# Patient Record
Sex: Female | Born: 1965 | Hispanic: Yes | Marital: Married | Smoking: Former smoker
Health system: Southern US, Community
[De-identification: ages and names within clinical notes are randomized; demographics above are authoritative.]

## PROBLEM LIST (undated history)

## (undated) DIAGNOSIS — M1990 Unspecified osteoarthritis, unspecified site: Secondary | ICD-10-CM

## (undated) DIAGNOSIS — I10 Essential (primary) hypertension: Secondary | ICD-10-CM

## (undated) DIAGNOSIS — J353 Hypertrophy of tonsils with hypertrophy of adenoids: Secondary | ICD-10-CM

## (undated) DIAGNOSIS — E119 Type 2 diabetes mellitus without complications: Secondary | ICD-10-CM

## (undated) DIAGNOSIS — N393 Stress incontinence (female) (male): Secondary | ICD-10-CM

## (undated) DIAGNOSIS — Z794 Long term (current) use of insulin: Secondary | ICD-10-CM

## (undated) DIAGNOSIS — R002 Palpitations: Secondary | ICD-10-CM

## (undated) DIAGNOSIS — K219 Gastro-esophageal reflux disease without esophagitis: Secondary | ICD-10-CM

## (undated) DIAGNOSIS — J4530 Mild persistent asthma, uncomplicated: Secondary | ICD-10-CM

## (undated) DIAGNOSIS — G4733 Obstructive sleep apnea (adult) (pediatric): Secondary | ICD-10-CM

## (undated) HISTORY — DX: Obstructive sleep apnea (adult) (pediatric): G47.33

---

## 1999-08-07 HISTORY — PX: BREAST ENHANCEMENT SURGERY: SHX7

## 2011-08-07 HISTORY — PX: CATARACT EXTRACTION W/ INTRAOCULAR LENS  IMPLANT, BILATERAL: SHX1307

## 2013-02-24 ENCOUNTER — Encounter (HOSPITAL_BASED_OUTPATIENT_CLINIC_OR_DEPARTMENT_OTHER): Payer: Self-pay | Admitting: *Deleted

## 2013-02-24 ENCOUNTER — Emergency Department (HOSPITAL_BASED_OUTPATIENT_CLINIC_OR_DEPARTMENT_OTHER)
Admission: EM | Admit: 2013-02-24 | Discharge: 2013-02-24 | Disposition: A | Discharge status: Home / Self Care | Payer: Medicare Other | Attending: Emergency Medicine | Admitting: Emergency Medicine

## 2013-02-24 DIAGNOSIS — R6889 Other general symptoms and signs: Secondary | ICD-10-CM | POA: Insufficient documentation

## 2013-02-24 DIAGNOSIS — E119 Type 2 diabetes mellitus without complications: Secondary | ICD-10-CM | POA: Insufficient documentation

## 2013-02-24 DIAGNOSIS — Z79899 Other long term (current) drug therapy: Secondary | ICD-10-CM | POA: Insufficient documentation

## 2013-02-24 DIAGNOSIS — T7840XA Allergy, unspecified, initial encounter: Secondary | ICD-10-CM

## 2013-02-24 DIAGNOSIS — L272 Dermatitis due to ingested food: Secondary | ICD-10-CM | POA: Insufficient documentation

## 2013-02-24 DIAGNOSIS — J45901 Unspecified asthma with (acute) exacerbation: Secondary | ICD-10-CM | POA: Insufficient documentation

## 2013-02-24 DIAGNOSIS — R609 Edema, unspecified: Secondary | ICD-10-CM | POA: Insufficient documentation

## 2013-02-24 DIAGNOSIS — R1310 Dysphagia, unspecified: Secondary | ICD-10-CM | POA: Insufficient documentation

## 2013-02-24 DIAGNOSIS — R0602 Shortness of breath: Secondary | ICD-10-CM | POA: Insufficient documentation

## 2013-02-24 MED ORDER — PREDNISONE 10 MG PO TABS: 20.0000 mg | ORAL_TABLET | Freq: Every day | ORAL | Status: DC

## 2013-02-24 MED ORDER — EPINEPHRINE 0.3 MG/0.3ML IJ SOAJ: 0.3000 mg | INTRAMUSCULAR | Status: DC | PRN

## 2013-02-24 MED ORDER — DIPHENHYDRAMINE HCL 25 MG PO TABS: 25.0000 mg | ORAL_TABLET | Freq: Four times a day (QID) | ORAL | Status: DC

## 2013-02-24 MED ORDER — METHYLPREDNISOLONE SODIUM SUCC 125 MG IJ SOLR
125.0000 mg | Freq: Once | INTRAMUSCULAR | Status: AC
  Administered 2013-02-24: 125 mg via INTRAVENOUS
  Filled 2013-02-24: qty 2

## 2013-02-24 NOTE — ED Notes (Signed)
Pt was also given 0.3mg  SQ by EMS

## 2013-02-24 NOTE — ED Notes (Signed)
Pt was eating dinner when she noticed that she had ingested shrimp, unknown amount. Pt reports feeling SOB and facial swelling.

## 2013-02-25 NOTE — ED Provider Notes (Signed)
History    CSN: [id number] Arrival date & time 02/24/13  2139  First MD Initiated Contact with Patient 02/24/13 2300     Chief Complaint  Patient presents with  . Allergic Reaction   (Consider location/radiation/quality/duration/timing/severity/associated sxs/prior Treatment) Patient is a 47 y.o. female presenting with allergic reaction. The history is provided by the patient and the EMS personnel. No language interpreter was used.  Allergic Reaction Presenting symptoms: difficulty breathing, difficulty swallowing, itching, swelling and wheezing   Presenting symptoms: no rash   Presenting symptoms comment:  Pt had inadvertantly eaten shrimp, to which she knows she is allergic.  She develop tightness in her throat and neck, and felt that her whole body was swelling.  She had some wheezing. Difficulty breathing:    Severity:  Severe   Onset quality:  Sudden   Duration:  1 hour   Timing:  Constant   Progression:  Unchanged Itching:    Location:  Full body   Severity:  Severe   Onset quality:  Sudden   Duration:  1 hour   Timing:  Constant   Progression:  Unchanged Severity:  Mild Prior episodes: Prior similar allergic reactions to eating shrimp. Context: food   Relieved by:  Nothing Worsened by:  Nothing tried Ineffective treatments:  None tried  Past Medical History  Diagnosis Date  . Diabetes mellitus without complication   . Asthma    History reviewed. No pertinent past surgical history. No family history on file. History  Substance Use Topics  . Smoking status: Not on file  . Smokeless tobacco: Not on file  . Alcohol Use: Not on file   OB History   Grav Para Term Preterm Abortions TAB SAB Ect Mult Living                 Review of Systems  Constitutional: Negative for fever and chills.  HENT: Positive for facial swelling and trouble swallowing.   Respiratory: Positive for shortness of breath and wheezing.   Cardiovascular: Negative.   Gastrointestinal:  Negative.   Genitourinary: Negative.   Musculoskeletal: Negative.   Skin: Positive for itching. Negative for rash.  Neurological: Negative.   Psychiatric/Behavioral: Negative.     Allergies  Shellfish allergy  Home Medications   Current Outpatient Rx  Name  Route  Sig  Dispense  Refill  . albuterol (PROVENTIL) (5 MG/ML) 0.5% nebulizer solution   Nebulization   Take 5 mg by nebulization every 6 (six) hours as needed for wheezing.         . diphenhydrAMINE (BENADRYL) 50 MG capsule   Intravenous   Inject 50 mg into the vein every 6 (six) hours as needed for itching.         Marland Kitchen ipratropium-albuterol (DUONEB) 0.5-2.5 (3) MG/3ML SOLN   Nebulization   Take 3 mLs by nebulization.         . ranitidine (ZANTAC) 15 MG/ML syrup   Intravenous   Inject 50 mg into the vein 2 (two) times daily.         . diphenhydrAMINE (BENADRYL) 25 MG tablet   Oral   Take 1 tablet (25 mg total) by mouth every 6 (six) hours.   20 tablet   0   . EPINEPHrine (EPIPEN) 0.3 mg/0.3 mL SOAJ   Intramuscular   Inject 0.3 mLs (0.3 mg total) into the muscle as needed (Inject yourself if you have an alllergic reaction, then come immediately to the ED for further treatment.).   1 Device  0   . predniSONE (DELTASONE) 10 MG tablet   Oral   Take 2 tablets (20 mg total) by mouth daily.   10 tablet   0    BP 136/62  Pulse 113  Temp(Src) 98.2 F (36.8 C) (Oral)  Resp 18  SpO2 98% Physical Exam  Nursing note and vitals reviewed. Constitutional: She is oriented to person, place, and time.  Middle aged woman in mild-moderate distress complaining of tightness in the throat, shortness of breath, and a feeling that her body was swelling.  HENT:  Head: Normocephalic and atraumatic.  Right Ear: External ear normal.  Left Ear: External ear normal.  Mouth/Throat: Oropharynx is clear and moist.  Eyes: Conjunctivae and EOM are normal. Pupils are equal, round, and reactive to light.  Neck: Normal range of  motion. Neck supple.  Airway patent.  Cardiovascular: Normal rate, regular rhythm and normal heart sounds.   Pulmonary/Chest: Effort normal and breath sounds normal.  No audible wheezes.  Abdominal: Soft. Bowel sounds are normal.  Musculoskeletal: Normal range of motion. She exhibits no edema and no tenderness.  Neurological: She is alert and oriented to person, place, and time.  No sensory or motor deficit.  Skin: Skin is warm and dry. No rash noted. No erythema.  Psychiatric: She has a normal mood and affect. Her behavior is normal.    ED Course  Procedures (including critical care time)  Course in the ED:  Pt had eaten shrimp; she thought it was fish.  She has a known allergy to shrimp.  She developed tightness in her throat and neck and hands.  It was hard to breathe.  EMS was called and they gave her albuterol nebulizer treatments, Pepcid, and Benadryl.  She was starting to feel better by the time she got to [facility] ED. Pt was seen and had physical examination.  She was given Solumedrol 125 mg IV.  Over an hour, her symptoms resolved.  She was prescribed prednisone 40 mg qd x 5 days, Benadryl 25 mg po q4h prn itching, and an Epipen, to use if she developed an allergic reaction in the future and then go immediately to a hospital ED for further evaluation and treatment.    1. Allergic reaction, initial encounter         Carleene Cooper III, MD 02/25/13 1304

## 2014-03-30 ENCOUNTER — Emergency Department (HOSPITAL_BASED_OUTPATIENT_CLINIC_OR_DEPARTMENT_OTHER)
Admission: EM | Admit: 2014-03-30 | Discharge: 2014-03-30 | Disposition: A | Discharge status: Home / Self Care | Payer: Medicare Other | Attending: Emergency Medicine | Admitting: Emergency Medicine

## 2014-03-30 ENCOUNTER — Emergency Department (HOSPITAL_BASED_OUTPATIENT_CLINIC_OR_DEPARTMENT_OTHER): Payer: Medicare Other

## 2014-03-30 ENCOUNTER — Emergency Department (HOSPITAL_COMMUNITY)
Admission: EM | Admit: 2014-03-30 | Discharge: 2014-03-30 | Discharge status: Against Medical Advice | Payer: Medicare Other

## 2014-03-30 ENCOUNTER — Encounter (HOSPITAL_BASED_OUTPATIENT_CLINIC_OR_DEPARTMENT_OTHER): Payer: Self-pay | Admitting: Emergency Medicine

## 2014-03-30 DIAGNOSIS — Z87891 Personal history of nicotine dependence: Secondary | ICD-10-CM | POA: Diagnosis not present

## 2014-03-30 DIAGNOSIS — Z8669 Personal history of other diseases of the nervous system and sense organs: Secondary | ICD-10-CM | POA: Insufficient documentation

## 2014-03-30 DIAGNOSIS — K2970 Gastritis, unspecified, without bleeding: Secondary | ICD-10-CM | POA: Insufficient documentation

## 2014-03-30 DIAGNOSIS — R52 Pain, unspecified: Secondary | ICD-10-CM | POA: Diagnosis not present

## 2014-03-30 DIAGNOSIS — J45909 Unspecified asthma, uncomplicated: Secondary | ICD-10-CM | POA: Insufficient documentation

## 2014-03-30 DIAGNOSIS — E119 Type 2 diabetes mellitus without complications: Secondary | ICD-10-CM | POA: Insufficient documentation

## 2014-03-30 DIAGNOSIS — IMO0002 Reserved for concepts with insufficient information to code with codable children: Secondary | ICD-10-CM | POA: Diagnosis not present

## 2014-03-30 DIAGNOSIS — D649 Anemia, unspecified: Secondary | ICD-10-CM | POA: Diagnosis not present

## 2014-03-30 DIAGNOSIS — Z79899 Other long term (current) drug therapy: Secondary | ICD-10-CM | POA: Insufficient documentation

## 2014-03-30 DIAGNOSIS — R109 Unspecified abdominal pain: Secondary | ICD-10-CM | POA: Diagnosis present

## 2014-03-30 DIAGNOSIS — K2990 Gastroduodenitis, unspecified, without bleeding: Secondary | ICD-10-CM | POA: Diagnosis not present

## 2014-03-30 DIAGNOSIS — K219 Gastro-esophageal reflux disease without esophagitis: Secondary | ICD-10-CM | POA: Insufficient documentation

## 2014-03-30 DIAGNOSIS — Z794 Long term (current) use of insulin: Secondary | ICD-10-CM | POA: Insufficient documentation

## 2014-03-30 HISTORY — DX: Gastro-esophageal reflux disease without esophagitis: K21.9

## 2014-03-30 LAB — CBC WITH DIFFERENTIAL/PLATELET
BASOS ABS: 0 10*3/uL (ref 0.0–0.1)
BASOS PCT: 0 % (ref 0–1)
BLASTS: 0 %
Band Neutrophils: 0 % (ref 0–10)
Eosinophils Absolute: 0.3 10*3/uL (ref 0.0–0.7)
Eosinophils Relative: 3 % (ref 0–5)
HCT: 27.6 % — ABNORMAL LOW (ref 36.0–46.0)
HEMOGLOBIN: 8.1 g/dL — AB (ref 12.0–15.0)
LYMPHS ABS: 2.8 10*3/uL (ref 0.7–4.0)
LYMPHS PCT: 27 % (ref 12–46)
MCH: 17.9 pg — AB (ref 26.0–34.0)
MCHC: 29.3 g/dL — AB (ref 30.0–36.0)
MCV: 60.9 fL — ABNORMAL LOW (ref 78.0–100.0)
MYELOCYTES: 0 %
Metamyelocytes Relative: 0 %
Monocytes Absolute: 0.3 10*3/uL (ref 0.1–1.0)
Monocytes Relative: 3 % (ref 3–12)
NEUTROS ABS: 6.9 10*3/uL (ref 1.7–7.7)
NEUTROS PCT: 67 % (ref 43–77)
PROMYELOCYTES ABS: 0 %
Platelets: 567 10*3/uL — ABNORMAL HIGH (ref 150–400)
RBC: 4.53 MIL/uL (ref 3.87–5.11)
RDW: 20 % — AB (ref 11.5–15.5)
WBC: 10.3 10*3/uL (ref 4.0–10.5)
nRBC: 0 /100 WBC

## 2014-03-30 LAB — URINALYSIS, ROUTINE W REFLEX MICROSCOPIC
BILIRUBIN URINE: NEGATIVE
Glucose, UA: 500 mg/dL — AB
Hgb urine dipstick: NEGATIVE
KETONES UR: NEGATIVE mg/dL
LEUKOCYTES UA: NEGATIVE
NITRITE: NEGATIVE
PH: 6.5 (ref 5.0–8.0)
PROTEIN: NEGATIVE mg/dL
Specific Gravity, Urine: 1.019 (ref 1.005–1.030)
UROBILINOGEN UA: 0.2 mg/dL (ref 0.0–1.0)

## 2014-03-30 LAB — COMPREHENSIVE METABOLIC PANEL
ALBUMIN: 3.1 g/dL — AB (ref 3.5–5.2)
ALK PHOS: 67 U/L (ref 39–117)
ALT: 100 U/L — ABNORMAL HIGH (ref 0–35)
AST: 91 U/L — AB (ref 0–37)
Anion gap: 15 (ref 5–15)
BUN: 15 mg/dL (ref 6–23)
CHLORIDE: 97 meq/L (ref 96–112)
CO2: 25 meq/L (ref 19–32)
Calcium: 9.4 mg/dL (ref 8.4–10.5)
Creatinine, Ser: 0.7 mg/dL (ref 0.50–1.10)
GFR calc Af Amer: >90 mL/min (ref >90)
Glucose, Bld: 177 mg/dL — ABNORMAL HIGH (ref 70–99)
POTASSIUM: 3.7 meq/L (ref 3.7–5.3)
Sodium: 137 mEq/L (ref 137–147)
Total Protein: 7.8 g/dL (ref 6.0–8.3)

## 2014-03-30 LAB — TROPONIN I: Troponin I: <0.3 ng/mL (ref <0.30)

## 2014-03-30 LAB — LIPASE, BLOOD: Lipase: 42 U/L (ref 11–59)

## 2014-03-30 MED ORDER — ESOMEPRAZOLE MAGNESIUM 40 MG PO CPDR: 40.0000 mg | DELAYED_RELEASE_CAPSULE | Freq: Two times a day (BID) | ORAL | Status: DC

## 2014-03-30 MED ORDER — FERROUS SULFATE 325 (65 FE) MG PO TABS: 325.0000 mg | ORAL_TABLET | Freq: Every day | ORAL | Status: DC

## 2014-03-30 MED ORDER — SUCRALFATE 1 G PO TABS: 1.0000 g | ORAL_TABLET | Freq: Four times a day (QID) | ORAL | Status: DC

## 2014-03-30 MED ORDER — HYDROCODONE-ACETAMINOPHEN 5-325 MG PO TABS
1.0000 | ORAL_TABLET | ORAL | Status: DC | PRN
Start: 2014-03-30 — End: 2015-08-18

## 2014-03-30 NOTE — Discharge Instructions (Signed)
Gastritis, Adult Gastritis is soreness and puffiness (inflammation) of the lining of the stomach. If you do not get help, gastritis can cause bleeding and sores (ulcers) in the stomach. HOME CARE   Only take medicine as told by your doctor.  If you were given antibiotic medicines, take them as told. Finish the medicines even if you start to feel better.  Drink enough fluids to keep your pee (urine) clear or pale yellow.  Avoid foods and drinks that make your problems worse. Foods you may want to avoid include:  Caffeine or alcohol.  Chocolate.  Mint.  Garlic and onions.  Spicy foods.  Citrus fruits, including oranges, lemons, or limes.  Food containing tomatoes, including sauce, chili, salsa, and pizza.  Fried and fatty foods.  Eat small meals throughout the day instead of large meals. GET HELP RIGHT AWAY IF:   You have black or dark red poop (stools).  You throw up (vomit) blood. It may look like coffee grounds.  You cannot keep fluids down.  Your belly (abdominal) pain gets worse.  You have a fever.  You do not feel better after 1 week.  You have any other questions or concerns. MAKE SURE YOU:   Understand these instructions.  Will watch your condition.  Will get help right away if you are not doing well or get worse. Document Released: 01/09/2008 Document Revised: 10/15/2011 Document Reviewed: 09/05/2011 [facility] Patient Information 2015 [place], [place]. This information is not intended to replace advice given to you by your health care provider. Make sure you discuss any questions you have with your health care provider.  Anemia, Nonspecific Anemia is a condition in which the concentration of red blood cells or hemoglobin in the blood is below normal. Hemoglobin is a substance in red blood cells that carries oxygen to the tissues of the body. Anemia results in not enough oxygen reaching these tissues.  CAUSES  Common causes of anemia include:   Excessive  bleeding. Bleeding may be internal or external. This includes excessive bleeding from periods (in women) or from the intestine.   Poor nutrition.   Chronic kidney, thyroid, and liver disease.  Bone marrow disorders that decrease red blood cell production.  Cancer and treatments for cancer.  HIV, AIDS, and their treatments.  Spleen problems that increase red blood cell destruction.  Blood disorders.  Excess destruction of red blood cells due to infection, medicines, and autoimmune disorders. SIGNS AND SYMPTOMS   Minor weakness.   Dizziness.   Headache.  Palpitations.   Shortness of breath, especially with exercise.   Paleness.  Cold sensitivity.  Indigestion.  Nausea.  Difficulty sleeping.  Difficulty concentrating. Symptoms may occur suddenly or they may develop slowly.  DIAGNOSIS  Additional blood tests are often needed. These help your health care provider determine the best treatment. Your health care provider will check your stool for blood and look for other causes of blood loss.  TREATMENT  Treatment varies depending on the cause of the anemia. Treatment can include:   Supplements of iron, vitamin I43, or folic acid.   Hormone medicines.   A blood transfusion. This may be needed if blood loss is severe.   Hospitalization. This may be needed if there is significant continual blood loss.   Dietary changes.  Spleen removal. HOME CARE INSTRUCTIONS Keep all follow-up appointments. It often takes many weeks to correct anemia, and having your health care provider check on your condition and your response to treatment is very important. SEEK IMMEDIATE  MEDICAL CARE IF:   You develop extreme weakness, shortness of breath, or chest pain.   You become dizzy or have trouble concentrating.  You develop heavy vaginal bleeding.   You develop a rash.   You have bloody or black, tarry stools.   You faint.   You vomit up blood.   You  vomit repeatedly.   You have abdominal pain.  You have a fever or persistent symptoms for more than 2-3 days.   You have a fever and your symptoms suddenly get worse.   You are dehydrated.  MAKE SURE YOU:  Understand these instructions.  Will watch your condition.  Will get help right away if you are not doing well or get worse. Document Released: 08/30/2004 Document Revised: 03/25/2013 Document Reviewed: 01/16/2013 [facility] Patient Information 2015 [place], [place]. This information is not intended to replace advice given to you by your health care provider. Make sure you discuss any questions you have with your health care provider.

## 2014-03-30 NOTE — ED Provider Notes (Signed)
Patient seen and evaluated. An EGD 4-6 weeks ago showing "inflammation" was on omeprazole. No medicine changes at time of that diagnosis. Describes epigastric pain and burning. Normal ultrasound. Normal hepatobiliary and pancreatic enzymes. Hemoglobin of 8. Describes history of anemia, thought secondary to menorrhagia by her primary care physician in April. No bleeding or dark stools now. Plan: Carafate, Vicodin, avoid Advil, Nexium.  PCP f/u for anemia, Iron.  Tanna Furry, MD 03/30/14 1233

## 2014-03-30 NOTE — ED Notes (Signed)
Abdominal pain x 11 days with daily vomiting.  Vomited x 2 in past 24 hours. Back pain that started yesterday.

## 2014-03-30 NOTE — ED Provider Notes (Signed)
CSN: [id number]     Arrival date & time 03/30/14  1012 History   First MD Initiated Contact with Patient 03/30/14 1039   History provided by patient. Husband at bedside.   Chief Complaint  Patient presents with  . Abdominal Pain   HPI  Patient reports complaint of persistent abdominal pain, nausea and vomiting. States that symptoms started about 11 days ago following a party where she believes she "got sick after eating the food", a few hours after eating she had generalized body aches, stomach cramps, nausea, vomiting, and diarrhea for several days, diarrhea has resolved now with occasional loose stools. Admits to improvement 2 days ago with reduced abdominal pain, now with worsening symptoms. Currently describes abdominal pain as constant aching "across upper" abdomen, significantly worse with eating and drinking, tried Pepto-bismal and Ginger ale with mild improvement, also pain relieved by vomiting. Last vomiting was 24 hours ago, denies bilious or bloody vomit. Denies any fevers/chills, cough, chest pain, SOB, bloody stool, constipation or change in bowel habits. Reports son with similar symptoms following close contact (currently improved).  Significant PMH with DM2, known GERD, s/p EGD and Colonoscopy (1 month ago at [facility]) reported to have "mild inflammation" unsure if this was located in esophagus or stomach, denies hx bleeding ulcer, taking omeprazole currently. No history of prior pancreatitis, cholecystitis, and no regular EtOH use. Also admits to prior history of anemia, and endorses some heavy bleeding with regular periods, LMP 03/13/14.  Past Medical History  Diagnosis Date  . Diabetes mellitus without complication   . Asthma   . GERD (gastroesophageal reflux disease)   . Cataract    Past Surgical History  Procedure Laterality Date  . Eye surgery     No family history on file. History  Substance Use Topics  . Smoking status: Former Smoker    Types: Cigarettes  .  Smokeless tobacco: Not on file  . Alcohol Use: Yes     Comment: occasional   OB History   Grav Para Term Preterm Abortions TAB SAB Ect Mult Living                 Review of Systems  See above HPI  Allergies  Shellfish allergy  Home Medications   Prior to Admission medications   Medication Sig Start Date End Date Taking? Authorizing Provider  Fluticasone-Salmeterol (ADVAIR HFA IN) Inhale into the lungs.   Yes Historical Provider, MD  insulin glargine (LANTUS) 100 UNIT/ML injection Inject 40 Units into the skin at bedtime.   Yes Historical Provider, MD  sitaGLIPtin-metformin (JANUMET) 50-1000 MG per tablet Take 1 tablet by mouth 2 (two) times daily with a meal.   Yes Historical Provider, MD  THEOPHYLLINE CR PO Take by mouth.   Yes Historical Provider, MD  albuterol (PROVENTIL) (5 MG/ML) 0.5% nebulizer solution Take 5 mg by nebulization every 6 (six) hours as needed for wheezing.    Historical Provider, MD  diphenhydrAMINE (BENADRYL) 25 MG tablet Take 1 tablet (25 mg total) by mouth every 6 (six) hours. 02/24/13   Mylinda Latina, MD  diphenhydrAMINE (BENADRYL) 50 MG capsule Inject 50 mg into the vein every 6 (six) hours as needed for itching.    Historical Provider, MD  EPINEPHrine (EPIPEN) 0.3 mg/0.3 mL SOAJ Inject 0.3 mLs (0.3 mg total) into the muscle as needed (Inject yourself if you have an alllergic reaction, then come immediately to the ED for further treatment.). 02/24/13   Mylinda Latina, MD  esomeprazole (NEXIUM) 40 MG capsule  Take 1 capsule (40 mg total) by mouth 2 (two) times daily. 03/30/14   Tanna Furry, MD  ferrous sulfate 325 (65 FE) MG tablet Take 1 tablet (325 mg total) by mouth daily. 03/30/14   Tanna Furry, MD  HYDROcodone-acetaminophen (NORCO/VICODIN) 5-325 MG per tablet Take 1 tablet by mouth every 4 (four) hours as needed. 03/30/14   Tanna Furry, MD  ipratropium-albuterol (DUONEB) 0.5-2.5 (3) MG/3ML SOLN Take 3 mLs by nebulization.    Historical Provider, MD  predniSONE  (DELTASONE) 10 MG tablet Take 2 tablets (20 mg total) by mouth daily. 02/24/13   Mylinda Latina, MD  ranitidine (ZANTAC) 15 MG/ML syrup Inject 50 mg into the vein 2 (two) times daily.    Historical Provider, MD  sucralfate (CARAFATE) 1 G tablet Take 1 tablet (1 g total) by mouth 4 (four) times daily. 03/30/14   Tanna Furry, MD   BP 143/74  Pulse 86  Temp(Src) 98 F (36.7 C) (Oral)  Resp 16  Ht 5\' 1"  (1.549 m)  Wt 170 lb (77.111 kg)  BMI 32.14 kg/m2  SpO2 99%  LMP 03/13/2014 Physical Exam  Gen - well-appearing, NAD HEENT - oropharynx clear, MMM Heart - tachycardia, regular rhythm, no murmurs heard Lungs - CTAB, no wheezing, crackles, or rhonchi. Normal work of breathing. Abd - soft, bilateral upper abdominal tenderness to palpation, no guarding or rebound, negative Murphy's, negative McBurney's, no masses, +hyperactive BS Ext - non-tender, no edema, peripheral pulses intact +2 b/l Skin - warm, dry, no rashes Neuro - awake, alert, oriented, grossly non-focal  ED Course  Procedures (including critical care time) Labs Review Labs Reviewed  URINALYSIS, ROUTINE W REFLEX MICROSCOPIC - Abnormal; Notable for the following:    Glucose, UA 500 (*)    All other components within normal limits  CBC WITH DIFFERENTIAL - Abnormal; Notable for the following:    Hemoglobin 8.1 (*)    HCT 27.6 (*)    MCV 60.9 (*)    MCH 17.9 (*)    MCHC 29.3 (*)    RDW 20.0 (*)    Platelets 567 (*)    All other components within normal limits  COMPREHENSIVE METABOLIC PANEL - Abnormal; Notable for the following:    Glucose, Bld 177 (*)    Albumin 3.1 (*)    AST 91 (*)    ALT 100 (*)    Total Bilirubin <0.2 (*)    All other components within normal limits  LIPASE, BLOOD  TROPONIN I    Imaging Review US Abdomen Complete  03/30/2014   CLINICAL DATA:  Abdominal pain  EXAM: ULTRASOUND ABDOMEN COMPLETE  COMPARISON:  None.  FINDINGS: Gallbladder:  No gallstones or wall thickening visualized. No sonographic  Murphy sign noted.  Common bile duct:  Diameter: 3 mm  Liver:  No focal lesion identified. Mild increased parenchymal echogenicity.  IVC:  No abnormality visualized.  Pancreas:  Visualized portion unremarkable.  Spleen:  Size and appearance within normal limits.  Right Kidney:  Length: 10.5 cm. Echogenicity within normal limits. No mass or hydronephrosis visualized.  Left Kidney:  Length: 11.5 cm. Echogenicity within normal limits. No mass or hydronephrosis visualized.  Abdominal aorta:  No aneurysm visualized.  Other findings:  None.  IMPRESSION: 1. No cholelithiasis or sonographic evidence of acute cholecystitis.   Electronically Signed   By: Kathreen Devoid   On: 03/30/2014 11:53     EKG Interpretation None      MDM   Final diagnoses:  Gastritis  Anemia, unspecified anemia  type   18 yr F with PMH DM2, GERD, s/p EGD (mild inflammation, ?esophagus vs stomach) and Colonoscopy (1 month ago). No hx of PUD, hematemesis, rectal bleeding or bloody stools. Presents with worsening abdominal pain with associated nausea/vomiting x 11 days (initially with n/v and diarrhea, since improved, then pain returned few days ago), constant pain in epigastric upper abd region worse with food/drink, also with back pain in similar region. Wide differential, suspect pain is related to GERD, potential esophageal ulcer, and possible gastritis, additionally concerned for possible cholecystitis (afebrile, negative Murphy's less likely, but inc risk factors), consider pancreatitis (with radiating back pain), unlikely appendicitis (neg McBurney, not consistent).  Proceed with EKG, Complete Abd [place], CMET, Lipase, CBC, UA.  UPDATE @ 1205 - Patient with stable complaints, no new symptoms, denies acute nausea or worsening abdominal pain. Able to tolerate PO. Results reviewed with EKG (NSR, no acute ST-T wave changes), Abd [place] (negative for cholelithiasis or cholecystitis, no other acute findings), CMET and Lipase (47) unremarkable, CBC  remarkable for Hgb 8.1 with MCV 60 (significant for microcytic anemia, suspected iron deficiency), WBC 10.3 (not suggestive of acute intra-abdominal infxn). Denies acute symptoms from Hgb, no lightheaded/dizziness, fatigue. Suspect chronic anemia, possible due to menometrorrhagia  Discharge to home with rx Nexium 40mg  BID (change from Omeprazole for indication of possible esophageal ulcer in setting of likely gastritis), rx Carafate 1g QID, start Ferrous sulfate 325mg  1 tab daily (may need to titrate up, cautious about worsening GI side-effects), recommended close follow-up with new PCP and GI doctors, return precautions given.     Nobie Putnam, DO 03/30/14 1306

## 2014-03-30 NOTE — ED Notes (Signed)
MD at bedside. 

## 2014-03-30 NOTE — ED Notes (Signed)
Pt reports intermittent chest pain associated with ambulating

## 2014-03-31 ENCOUNTER — Encounter (HOSPITAL_BASED_OUTPATIENT_CLINIC_OR_DEPARTMENT_OTHER): Payer: Self-pay | Admitting: Emergency Medicine

## 2014-03-31 ENCOUNTER — Emergency Department (HOSPITAL_BASED_OUTPATIENT_CLINIC_OR_DEPARTMENT_OTHER)
Admission: EM | Admit: 2014-03-31 | Discharge: 2014-03-31 | Disposition: A | Discharge status: Home / Self Care | Payer: Medicare Other | Attending: Emergency Medicine | Admitting: Emergency Medicine

## 2014-03-31 DIAGNOSIS — E119 Type 2 diabetes mellitus without complications: Secondary | ICD-10-CM | POA: Diagnosis not present

## 2014-03-31 DIAGNOSIS — R1012 Left upper quadrant pain: Secondary | ICD-10-CM | POA: Diagnosis not present

## 2014-03-31 DIAGNOSIS — R1011 Right upper quadrant pain: Secondary | ICD-10-CM | POA: Insufficient documentation

## 2014-03-31 DIAGNOSIS — Z79899 Other long term (current) drug therapy: Secondary | ICD-10-CM | POA: Diagnosis not present

## 2014-03-31 DIAGNOSIS — Z87891 Personal history of nicotine dependence: Secondary | ICD-10-CM | POA: Insufficient documentation

## 2014-03-31 DIAGNOSIS — K219 Gastro-esophageal reflux disease without esophagitis: Secondary | ICD-10-CM | POA: Diagnosis not present

## 2014-03-31 DIAGNOSIS — J45901 Unspecified asthma with (acute) exacerbation: Secondary | ICD-10-CM | POA: Insufficient documentation

## 2014-03-31 DIAGNOSIS — Z8669 Personal history of other diseases of the nervous system and sense organs: Secondary | ICD-10-CM | POA: Diagnosis not present

## 2014-03-31 DIAGNOSIS — R112 Nausea with vomiting, unspecified: Secondary | ICD-10-CM | POA: Insufficient documentation

## 2014-03-31 DIAGNOSIS — R1013 Epigastric pain: Secondary | ICD-10-CM | POA: Diagnosis present

## 2014-03-31 DIAGNOSIS — Z794 Long term (current) use of insulin: Secondary | ICD-10-CM | POA: Insufficient documentation

## 2014-03-31 DIAGNOSIS — R51 Headache: Secondary | ICD-10-CM | POA: Insufficient documentation

## 2014-03-31 MED ORDER — PROMETHAZINE HCL 12.5 MG PO TABS: 12.5000 mg | ORAL_TABLET | Freq: Four times a day (QID) | ORAL | Status: DC | PRN

## 2014-03-31 MED ORDER — HYDROMORPHONE HCL PF 1 MG/ML IJ SOLN
INTRAMUSCULAR | Status: AC
  Administered 2014-03-31: 2 mg via INTRAMUSCULAR
  Filled 2014-03-31: qty 2

## 2014-03-31 MED ORDER — HYDROMORPHONE HCL PF 2 MG/ML IJ SOLN
2.0000 mg | Freq: Once | INTRAMUSCULAR | Status: AC
Start: 2014-03-31 — End: 2014-03-31
  Filled 2014-03-31: qty 1

## 2014-03-31 MED ORDER — ONDANSETRON 4 MG PO TBDP: 4.0000 mg | ORAL_TABLET | Freq: Three times a day (TID) | ORAL | Status: DC | PRN

## 2014-03-31 MED ORDER — ONDANSETRON 4 MG PO TBDP
4.0000 mg | ORAL_TABLET | Freq: Once | ORAL | Status: AC
  Administered 2014-03-31: 4 mg via ORAL
  Filled 2014-03-31: qty 1

## 2014-03-31 NOTE — ED Notes (Signed)
MD at bedside. 

## 2014-03-31 NOTE — ED Provider Notes (Signed)
CSN: [id number]     Arrival date & time 03/31/14  1850 History  This chart was scribed for Leslie Sorrow, MD by Martinique Peace, ED Scribe. The patient was seen in [place]. The patient's care was started at 8:32 PM.    Chief Complaint  Patient presents with  . Abdominal Pain      Patient is a 48 y.o. female presenting with abdominal pain. The history is provided by the patient. No language interpreter was used.  Abdominal Pain Pain location:  Epigastric, LUQ and RUQ Pain quality: cramping   Pain radiates to:  Back Pain severity:  Severe Duration:  12 days Progression:  Unchanged Relieved by:  Nothing Worsened by:  Eating, deep breathing and vomiting Associated symptoms: fatigue, nausea, shortness of breath and vomiting   Associated symptoms: no chest pain, no chills, no cough, no diarrhea, no fever and no sore throat   Risk factors: alcohol abuse (occasional)   HPI Comments: Leslie Scott is a 48 y.o. female who presents to the Emergency Department complaining of continued aching epigastric abdominal pain that radiates bilaterally around her abdomen to her back with associated headache, nausea, and vomiting. She rates pain as 10/10 currently and denies any type of relief or improvement in pain. Pt reports that pain is exacerbated when she eats within 20 mins after consumption. Pt futher reports having upper endoscopy performed 2 months ago. Pt was seen yesterday here at the ED and given prescriptions for Nexium (40mg ), Carafate (1g), and Ferrous Sulfate (1 tablet). One episode of vomiting this morning.    Past Medical History  Diagnosis Date  . Diabetes mellitus without complication   . Asthma   . GERD (gastroesophageal reflux disease)   . Cataract    Past Surgical History  Procedure Laterality Date  . Eye surgery     No family history on file. History  Substance Use Topics  . Smoking status: Former Smoker    Types: Cigarettes  . Smokeless tobacco: Not on file  . Alcohol  Use: Yes     Comment: occasional   OB History   Grav Para Term Preterm Abortions TAB SAB Ect Mult Living                 Review of Systems  Constitutional: Positive for fatigue. Negative for fever and chills.  HENT: Negative for rhinorrhea and sore throat.   Eyes: Negative for visual disturbance.  Respiratory: Positive for shortness of breath. Negative for cough.   Cardiovascular: Negative for chest pain and leg swelling.  Gastrointestinal: Positive for nausea, vomiting and abdominal pain. Negative for diarrhea.  Musculoskeletal: Negative for back pain.  Skin: Negative for rash.  Neurological: Positive for headaches.  Hematological: Does not bruise/bleed easily.  Psychiatric/Behavioral: Negative for confusion.      Allergies  Shellfish allergy  Home Medications   Prior to Admission medications   Medication Sig Start Date End Date Taking? Authorizing Provider  albuterol (PROVENTIL) (5 MG/ML) 0.5% nebulizer solution Take 5 mg by nebulization every 6 (six) hours as needed for wheezing.    Historical Provider, MD  diphenhydrAMINE (BENADRYL) 25 MG tablet Take 1 tablet (25 mg total) by mouth every 6 (six) hours. 02/24/13   Mylinda Latina, MD  diphenhydrAMINE (BENADRYL) 50 MG capsule Inject 50 mg into the vein every 6 (six) hours as needed for itching.    Historical Provider, MD  EPINEPHrine (EPIPEN) 0.3 mg/0.3 mL SOAJ Inject 0.3 mLs (0.3 mg total) into the muscle as needed (Inject yourself if  you have an alllergic reaction, then come immediately to the ED for further treatment.). 02/24/13   Mylinda Latina, MD  esomeprazole (NEXIUM) 40 MG capsule Take 1 capsule (40 mg total) by mouth 2 (two) times daily. 03/30/14   Tanna Furry, MD  ferrous sulfate 325 (65 FE) MG tablet Take 1 tablet (325 mg total) by mouth daily. 03/30/14   Tanna Furry, MD  Fluticasone-Salmeterol (ADVAIR HFA IN) Inhale into the lungs.    Historical Provider, MD  HYDROcodone-acetaminophen (NORCO/VICODIN) 5-325 MG per tablet  Take 1 tablet by mouth every 4 (four) hours as needed. 03/30/14   Tanna Furry, MD  insulin glargine (LANTUS) 100 UNIT/ML injection Inject 40 Units into the skin at bedtime.    Historical Provider, MD  ipratropium-albuterol (DUONEB) 0.5-2.5 (3) MG/3ML SOLN Take 3 mLs by nebulization.    Historical Provider, MD  ondansetron (ZOFRAN ODT) 4 MG disintegrating tablet Take 1 tablet (4 mg total) by mouth every 8 (eight) hours as needed for nausea or vomiting. 03/31/14   Leslie Sorrow, MD  predniSONE (DELTASONE) 10 MG tablet Take 2 tablets (20 mg total) by mouth daily. 02/24/13   Mylinda Latina, MD  promethazine (PHENERGAN) 12.5 MG tablet Take 1 tablet (12.5 mg total) by mouth every 6 (six) hours as needed for nausea or vomiting. 03/31/14   Leslie Sorrow, MD  ranitidine (ZANTAC) 15 MG/ML syrup Inject 50 mg into the vein 2 (two) times daily.    Historical Provider, MD  sitaGLIPtin-metformin (JANUMET) 50-1000 MG per tablet Take 1 tablet by mouth 2 (two) times daily with a meal.    Historical Provider, MD  sucralfate (CARAFATE) 1 G tablet Take 1 tablet (1 g total) by mouth 4 (four) times daily. 03/30/14   Tanna Furry, MD  THEOPHYLLINE CR PO Take by mouth.    Historical Provider, MD   BP 170/80  Pulse 108  Temp(Src) 98 F (36.7 C) (Oral)  Resp 18  Ht 5\' 1"  (1.549 m)  Wt 170 lb (77.111 kg)  BMI 32.14 kg/m2  SpO2 99%  LMP 03/13/2014 Physical Exam  Nursing note and vitals reviewed. Constitutional: She is oriented to person, place, and time. She appears well-developed and well-nourished.  HENT:  Head: Normocephalic and atraumatic.  Mouth/Throat: Oropharynx is clear and moist.  Eyes: Conjunctivae and EOM are normal. No scleral icterus.  Neck: Normal range of motion.  Cardiovascular: Normal rate, regular rhythm and normal heart sounds.   No murmur heard. Pulmonary/Chest: Effort normal and breath sounds normal. No respiratory distress.  Abdominal: Soft. Bowel sounds are normal. There is tenderness (LUQ and  RUQ). There is no guarding.  Neurological: She is alert and oriented to person, place, and time. No cranial nerve deficit. She exhibits normal muscle tone. Coordination normal.    ED Course  Procedures (including critical care time) Labs Review Labs Reviewed - No data to display  Results for orders placed during the hospital encounter of 03/30/14  URINALYSIS, ROUTINE W REFLEX MICROSCOPIC      Result Value Ref Range   Color, Urine YELLOW  YELLOW   APPearance CLEAR  CLEAR   Specific Gravity, Urine 1.019  1.005 - 1.030   pH 6.5  5.0 - 8.0   Glucose, UA 500 (*) NEGATIVE mg/dL   Hgb urine dipstick NEGATIVE  NEGATIVE   Bilirubin Urine NEGATIVE  NEGATIVE   Ketones, ur NEGATIVE  NEGATIVE mg/dL   Protein, ur NEGATIVE  NEGATIVE mg/dL   Urobilinogen, UA 0.2  0.0 - 1.0 mg/dL   Nitrite NEGATIVE  NEGATIVE   Leukocytes, UA NEGATIVE  NEGATIVE  CBC WITH DIFFERENTIAL      Result Value Ref Range   WBC 10.3  4.0 - 10.5 K/uL   RBC 4.53  3.87 - 5.11 MIL/uL   Hemoglobin 8.1 (*) 12.0 - 15.0 g/dL   HCT 27.6 (*) 36.0 - 46.0 %   MCV 60.9 (*) 78.0 - 100.0 fL   MCH 17.9 (*) 26.0 - 34.0 pg   MCHC 29.3 (*) 30.0 - 36.0 g/dL   RDW 20.0 (*) 11.5 - 15.5 %   Platelets 567 (*) 150 - 400 K/uL   Neutrophils Relative % 67  43 - 77 %   Lymphocytes Relative 27  12 - 46 %   Monocytes Relative 3  3 - 12 %   Eosinophils Relative 3  0 - 5 %   Basophils Relative 0  0 - 1 %   Band Neutrophils 0  0 - 10 %   Metamyelocytes Relative 0     Myelocytes 0     Promyelocytes Absolute 0     Blasts 0     nRBC 0  0 /100 WBC   Neutro Abs 6.9  1.7 - 7.7 K/uL   Lymphs Abs 2.8  0.7 - 4.0 K/uL   Monocytes Absolute 0.3  0.1 - 1.0 K/uL   Eosinophils Absolute 0.3  0.0 - 0.7 K/uL   Basophils Absolute 0.0  0.0 - 0.1 K/uL   WBC Morphology ATYPICAL LYMPHOCYTES    COMPREHENSIVE METABOLIC PANEL      Result Value Ref Range   Sodium 137  137 - 147 mEq/L   Potassium 3.7  3.7 - 5.3 mEq/L   Chloride 97  96 - 112 mEq/L   CO2 25  19 - 32  mEq/L   Glucose, Bld 177 (*) 70 - 99 mg/dL   BUN 15  6 - 23 mg/dL   Creatinine, Ser 0.70  0.50 - 1.10 mg/dL   Calcium 9.4  8.4 - 10.5 mg/dL   Total Protein 7.8  6.0 - 8.3 g/dL   Albumin 3.1 (*) 3.5 - 5.2 g/dL   AST 91 (*) 0 - 37 U/L   ALT 100 (*) 0 - 35 U/L   Alkaline Phosphatase 67  39 - 117 U/L   Total Bilirubin <0.2 (*) 0.3 - 1.2 mg/dL   GFR calc non Af Amer >90  >90 mL/min   GFR calc Af Amer >90  >90 mL/min   Anion gap 15  5 - 15  LIPASE, BLOOD      Result Value Ref Range   Lipase 42  11 - 59 U/L  TROPONIN I      Result Value Ref Range   Troponin I <0.30  <0.30 ng/mL    Imaging Review US Abdomen Complete  03/30/2014   CLINICAL DATA:  Abdominal pain  EXAM: ULTRASOUND ABDOMEN COMPLETE  COMPARISON:  None.  FINDINGS: Gallbladder:  No gallstones or wall thickening visualized. No sonographic Murphy sign noted.  Common bile duct:  Diameter: 3 mm  Liver:  No focal lesion identified. Mild increased parenchymal echogenicity.  IVC:  No abnormality visualized.  Pancreas:  Visualized portion unremarkable.  Spleen:  Size and appearance within normal limits.  Right Kidney:  Length: 10.5 cm. Echogenicity within normal limits. No mass or hydronephrosis visualized.  Left Kidney:  Length: 11.5 cm. Echogenicity within normal limits. No mass or hydronephrosis visualized.  Abdominal aorta:  No aneurysm visualized.  Other findings:  None.  IMPRESSION: 1. No  cholelithiasis or sonographic evidence of acute cholecystitis.   Electronically Signed   By: Kathreen Devoid   On: 03/30/2014 11:53     EKG Interpretation None     Medications  HYDROmorphone (DILAUDID) injection 2 mg (not administered)  ondansetron (ZOFRAN-ODT) disintegrating tablet 4 mg (4 mg Oral Given 03/31/14 2117)  HYDROmorphone (DILAUDID) 1 MG/ML injection (2 mg  Given 03/31/14 2118)    8:39 PM- Treatment plan was discussed with patient who verbalizes understanding and agrees.  MDM   Final diagnoses:  Epigastric abdominal pain    Patient  symptoms seem to be consistent with epigastric abdominal pain with a negative ultrasound in the labs without significant abnormalities yesterday. May very well represent gastritis or peptic ulcer disease. Patient started on Carafate Nexium yesterday. Patient not significantly worse today. Patient's nausea not well controlled. Patient will be treated with Phenergan and Zofran in addition to the medications and pain medicine she was provided yesterday. Patient needs to follow back up with [facility] for her GI doctors. Redo of her upper endoscopy may be warranted.  I personally performed the services described in this documentation, which was scribed in my presence. The recorded information has been reviewed and is accurate.     Leslie Sorrow, MD 03/31/14 2120

## 2014-03-31 NOTE — Discharge Instructions (Signed)
With your doctors at [facility]. UGI doctors may want to redo the upper endoscopy. Take pain medication as directed as prescribed yesterday. Take the Nexium and Carafate as directed. Today prescribed Zofran and Phenergan as needed for nausea.

## 2014-03-31 NOTE — ED Notes (Signed)
Reports continued pain from yesterday. Sts medications aren't working. Reports abdominal pain, back pain and headache

## 2014-04-01 ENCOUNTER — Telehealth (HOSPITAL_BASED_OUTPATIENT_CLINIC_OR_DEPARTMENT_OTHER): Payer: Self-pay | Admitting: Emergency Medicine

## 2014-04-01 NOTE — ED Notes (Signed)
Pt called stating she thought she was allergic to medication that was given to her last pm, denies problems at this time, stated she was itching, explained to patient that was a side effect. Pt just wanted to know the name of medication that was given, informed patient that it was on her discharge paperwork, but information was given. Pt denies any further problems

## 2014-04-03 NOTE — ED Provider Notes (Signed)
I saw and evaluated the patient, reviewed the resident's note and I agree with the findings and plan.   EKG Interpretation   Date/Time:  Tuesday March 30 2014 11:14:49 EDT Ventricular Rate:  95 PR Interval:  178 QRS Duration: 82 QT Interval:  364 QTC Calculation: 457 R Axis:   59 Text Interpretation:  Normal sinus rhythm Nonspecific T wave abnormality  Abnormal ECG Confirmed by Jeneen Rinks  MD, [place] ([id number]) on 03/30/2014 2:49:25 PM      Please see my additional dictation.  Tanna Furry, MD 04/03/14 [id number]

## 2014-07-26 ENCOUNTER — Other Ambulatory Visit: Payer: Self-pay | Admitting: Internal Medicine

## 2014-07-26 DIAGNOSIS — R911 Solitary pulmonary nodule: Secondary | ICD-10-CM

## 2014-07-27 ENCOUNTER — Ambulatory Visit
Admission: RE | Admit: 2014-07-27 | Discharge: 2014-07-27 | Disposition: A | Discharge status: Home / Self Care | Payer: Medicare Other | Source: Ambulatory Visit | Attending: Internal Medicine | Admitting: Internal Medicine

## 2014-07-27 DIAGNOSIS — R911 Solitary pulmonary nodule: Secondary | ICD-10-CM

## 2014-08-12 ENCOUNTER — Encounter: Payer: Self-pay | Admitting: Internal Medicine

## 2014-08-12 ENCOUNTER — Ambulatory Visit (INDEPENDENT_AMBULATORY_CARE_PROVIDER_SITE_OTHER): Payer: Medicare Other | Admitting: Internal Medicine

## 2014-08-12 VITALS — BP 144/96 | HR 111 | Ht 62.0 in | Wt 175.0 lb

## 2014-08-12 DIAGNOSIS — J4530 Mild persistent asthma, uncomplicated: Secondary | ICD-10-CM | POA: Insufficient documentation

## 2014-08-12 DIAGNOSIS — R918 Other nonspecific abnormal finding of lung field: Secondary | ICD-10-CM

## 2014-08-12 DIAGNOSIS — R0600 Dyspnea, unspecified: Secondary | ICD-10-CM

## 2014-08-12 MED ORDER — MOMETASONE FURO-FORMOTEROL FUM 200-5 MCG/ACT IN AERO: INHALATION_SPRAY | RESPIRATORY_TRACT | Status: DC

## 2014-08-12 NOTE — Patient Instructions (Addendum)
No more CT scans for 6 months   Stop theophylline and advair   Dulera 200 Take 2 puffs first thing in am and then another 2 puffs about 12 hours later and if happy with it then fill the Rx   Work on inhaler technique:  relax and gently blow all the way out then take a nice smooth deep breath back in, triggering the inhaler at same time you start breathing in.  Hold for up to 5 seconds if you can.  Rinse and gargle with water when done    Only use your albuterol(proair)  as a rescue medication to be used if you can't catch your breath by resting or doing a relaxed purse lip breathing pattern.  - The less you use it, the better it will work when you need it. - Ok to use up to 2 puffs  every 4 hours if you must but call for immediate appointment if use goes up over your usual need - Don't leave home without it !!  (think of it like the spare tire for your car)   Nexium 40 mg Take 30- 60 min before your first and last meals of the day   GERD (REFLUX)  is an extremely common cause of respiratory symptoms just like yours , many times with no obvious heartburn at all.    It can be treated with medication, but also with lifestyle changes including avoidance of late meals, excessive alcohol, smoking cessation, and avoid fatty foods, chocolate, peppermint, colas, red wine, and acidic juices such as orange juice.  NO MINT OR MENTHOL PRODUCTS SO NO COUGH DROPS  USE SUGARLESS CANDY INSTEAD (Jolley ranchers or Stover's or Life Savers) or even ice chips will also do - the key is to swallow to prevent all throat clearing. NO OIL BASED VITAMINS - use powdered substitutes.  Please schedule a follow up office visit in 6 weeks, call sooner if needed pfts

## 2014-08-12 NOTE — Progress Notes (Signed)
   Subjective:    Patient ID: Leslie Scott, female    DOB: [birth year]    MRN: [id number]  HPI  49 yo PR with asthma since age 71 on Theophylline ever since better in 2006 breathing while in [place] and reduction to may be once a day but breathing worse in 2014 despite quit smoking 2007 arrived [place] 2013 and referred to pulmonary clinic 08/12/13 by Dr Doristine Section bonsu for eval of lung nodule     08/12/2014 1st [facility] office visit/ Wert  / maintained on advair and theoph Chief Complaint  Patient presents with  . Pulmonary Consult    Referred by Dr. Vista Lawman for eval of pulmonary nodule. Pt states that she was dxed with asthma at age 79. She c/o increased SOB for the past yr.  She states that she gets SOB with "any rapid movement".    last pred x 6 months/ still on theoph with recent dx of gerd at [facility]/ has chest tightness better with sab and doe x anything more than slow adls x years  No obvious other patterns in day to day or daytime variabilty or assoc chronic cough or cp or chest tightness, subjective wheeze or overt sinus  symptoms. No unusual exp hx or h/o childhood pna/ asthma or knowledge of premature birth.  Sleeping ok without nocturnal  or early am exacerbation  of respiratory  c/o's or need for noct saba. Also denies any obvious fluctuation of symptoms with weather or environmental changes or other aggravating or alleviating factors except as outlined above   Current Medications, Allergies, Complete Past Medical History, Past Surgical History, Family History, and Social History were reviewed in [employer] record.            Review of Systems  Constitutional: Negative for fever, chills and unexpected weight change.  HENT: Positive for sneezing and sore throat. Negative for congestion, dental problem, ear pain, nosebleeds, postnasal drip, rhinorrhea, sinus pressure, trouble swallowing and voice change.   Eyes: Negative for visual disturbance.    Respiratory: Positive for shortness of breath. Negative for cough and choking.   Cardiovascular: Positive for chest pain. Negative for leg swelling.  Gastrointestinal: Negative for vomiting, abdominal pain and diarrhea.  Genitourinary: Negative for difficulty urinating.       Acid heartburn  Musculoskeletal: Positive for arthralgias.  Skin: Negative for rash.  Neurological: Positive for headaches. Negative for tremors and syncope.  Hematological: Does not bruise/bleed easily.       Objective:   Physical Exam  amb slt cushingnoid mod hoarse latino female nad  Wt Readings from Last 3 Encounters:  08/12/14 175 lb (79.379 kg)  03/31/14 170 lb (77.111 kg)  03/30/14 170 lb (77.111 kg)    Vital signs reviewed  HEENT: nl dentition, turbinates, and orophanx. Nl external ear canals without cough reflex   NECK :  without JVD/Nodes/TM/ nl carotid upstrokes bilaterally   LUNGS: no acc muscle use, clear to A and P bilaterally without cough on insp or exp maneuvers   CV:  RRR  no s3 or murmur or increase in P2, no edema   ABD:  soft and nontender with nl excursion in the supine position. No bruits or organomegaly, bowel sounds nl  MS:  warm without deformities, calf tenderness, cyanosis or clubbing  SKIN: warm and dry without lesions    NEURO:  alert, approp, no deficits          Assessment & Plan:

## 2014-08-15 ENCOUNTER — Encounter: Payer: Self-pay | Admitting: Internal Medicine

## 2014-08-15 DIAGNOSIS — R918 Other nonspecific abnormal finding of lung field: Secondary | ICD-10-CM | POA: Insufficient documentation

## 2014-08-15 NOTE — Assessment & Plan Note (Signed)
CT results reviewed with pt >>> Too small for PET or bx, not suspicious enough for excisional bx > really only option for now is follow the Fleischner society guidelines as rec by radiology.   Discussed in detail all the  indications, usual  risks and alternatives  relative to the benefits with patient who agrees to proceed with f/u ct in 6 months

## 2014-08-15 NOTE — Assessment & Plan Note (Addendum)
Symptoms are markedly disproportionate to objective findings and not clear this is a lung problem but pt does appear to have difficult airway management issues. DDX of  difficult airways management all start with A and  include Adherence, Ace Inhibitors, Acid Reflux, Active Sinus Disease, Alpha 1 Antitripsin deficiency, Anxiety masquerading as Airways dz,  ABPA,  allergy(esp in young), Aspiration (esp in elderly), Adverse effects of DPI,  Active smokers, plus two Bs  = Bronchiectasis and Beta blocker use..and one C= CHF  Adherence is always the initial "prime suspect" and is a multilayered concern that requires a "trust but verify" approach in every patient - starting with knowing how to use medications, especially inhalers, correctly, keeping up with refills and understanding the fundamental difference between maintenance and prns vs those medications only taken for a very short course and then stopped and not refilled.  The proper method of use, as well as anticipated side effects, of a metered-dose inhaler are discussed and demonstrated to the patient. Improved effectiveness after extensive coaching during this visit to a level of approximately  75% from a baseline of < 25 % so try dulera 200 2bid  ? Acid (or non-acid) GERD > always difficult to exclude as up to 75% of pts in some series report no assoc GI/ Heartburn symptoms> rec stop theophylline and  max (24h)  acid suppression and diet restrictions/ reviewed and instructions given in writing.   ? Adverse effects of dpi  > try off advair   Needs f/u pfts - See instructions for specific recommendations which were reviewed directly with the patient who was given a copy with highlighter outlining the key components.

## 2014-09-17 ENCOUNTER — Telehealth: Payer: Self-pay | Admitting: Internal Medicine

## 2014-09-17 MED ORDER — MOMETASONE FURO-FORMOTEROL FUM 200-5 MCG/ACT IN AERO: INHALATION_SPRAY | RESPIRATORY_TRACT | Status: DC

## 2014-09-17 NOTE — Telephone Encounter (Signed)
Pt aware that Rx has been sent. Nothing more needed at this time.

## 2014-09-24 ENCOUNTER — Ambulatory Visit (INDEPENDENT_AMBULATORY_CARE_PROVIDER_SITE_OTHER): Payer: Medicare HMO | Admitting: Internal Medicine

## 2014-09-24 ENCOUNTER — Encounter: Payer: Self-pay | Admitting: Internal Medicine

## 2014-09-24 VITALS — BP 136/86 | HR 89 | Ht 62.0 in | Wt 184.0 lb

## 2014-09-24 DIAGNOSIS — R0600 Dyspnea, unspecified: Secondary | ICD-10-CM

## 2014-09-24 DIAGNOSIS — J4530 Mild persistent asthma, uncomplicated: Secondary | ICD-10-CM

## 2014-09-24 LAB — PULMONARY FUNCTION TEST
DL/VA % PRED: 93 %
DL/VA: 4.25 ml/min/mmHg/L
DLCO UNC: 17.21 ml/min/mmHg
DLCO unc % pred: 79 %
FEF 25-75 PRE: 1.23 L/s
FEF 25-75 Post: 1.55 L/sec
FEF2575-%Change-Post: 26 %
FEF2575-%PRED-PRE: 47 %
FEF2575-%Pred-Post: 59 %
FEV1-%CHANGE-POST: 8 %
FEV1-%Pred-Post: 72 %
FEV1-%Pred-Pre: 66 %
FEV1-Post: 1.88 L
FEV1-Pre: 1.73 L
FEV1FVC-%Change-Post: 0 %
FEV1FVC-%Pred-Pre: 90 %
FEV6-%Change-Post: 9 %
FEV6-%PRED-POST: 81 %
FEV6-%PRED-PRE: 74 %
FEV6-POST: 2.58 L
FEV6-PRE: 2.37 L
FEV6FVC-%PRED-POST: 103 %
FEV6FVC-%PRED-PRE: 103 %
FVC-%Change-Post: 7 %
FVC-%PRED-PRE: 73 %
FVC-%Pred-Post: 78 %
FVC-POST: 2.58 L
FVC-PRE: 2.4 L
Post FEV1/FVC ratio: 73 %
Post FEV6/FVC ratio: 100 %
Pre FEV1/FVC ratio: 72 %
Pre FEV6/FVC Ratio: 100 %
RV % pred: 93 %
RV: 1.6 L
TLC % pred: 87 %
TLC: 4.14 L

## 2014-09-24 NOTE — Progress Notes (Signed)
PFT done today. 

## 2014-09-24 NOTE — Patient Instructions (Addendum)
Weight control is simply a matter of calorie balance which needs to be tilted in your favor by eating less and exercising more.  To get the most out of exercise, you need to be continuously aware that you are short of breath, but never out of breath, for 30 minutes daily. As you improve, it will actually be easier for you to do the same amount of exercise  in  30 minutes so always push to the level where you are short of breath.  If this does not result in gradual weight reduction then I strongly recommend you see a nutritionist with a food diary x 2 weeks so that we can work out a negative calorie balance which is universally effective in steady weight loss programs.  Think of your calorie balance like you do your bank account where in this case you want the balance to go down so you must take in less calories than you burn up.  It's just that simple:  Hard to do, but easy to understand.  Good luck!   Continue dulera 200 Take 2 puffs first thing in am and then another 2 puffs about 12 hours later.   Work on Engineer, technical sales technique:  relax and gently blow all the way out then take a nice smooth deep breath back in, triggering the inhaler at same time you start breathing in.  Hold for up to 5 seconds if you can.  Rinse and gargle with water when done  Please schedule a follow up visit in 3 months but call sooner if needed

## 2014-09-24 NOTE — Progress Notes (Signed)
Subjective:    Patient ID: Leslie Scott, female    DOB: 1966-02-04    MRN: [id number]    Brief patient profile:  49 yo PR former smoker  with asthma since age 40 on Theophylline ever since better in 2006 breathing while in [place] and reduction to may be once a day but breathing worse in 2014 despite quit smoking 2007 [place] 2013 and referred to pulmonary clinic 08/12/13 by Dr Doristine Section bonsu for eval of lung nodule      History of Present Illness  08/12/2014 1st [facility] office visit/ Melvyn Novas  / maintained on advair and theoph Chief Complaint  Patient presents with  . Pulmonary Consult    Referred by Dr. Vista Lawman for eval of pulmonary nodule. Pt states that she was dxed with asthma at age 23. She c/o increased SOB for the past yr.  She states that she gets SOB with "any rapid movement".    last pred x 6 months/ still on theoph with recent dx of gerd at [facility]/ has chest tightness better with saba and doe x anything more than slow adls x years rec No more CT scans for 6 months  Stop theophylline and advair  Dulera 200 Take 2 puffs first thing in am and then another 2 puffs about 12 hours later and if happy with it then fill the Rx  Work on inhaler technique:  relax and gently blow all the way out then take a nice smooth deep breath back in, triggering the inhaler at same time you start breathing in.  Hold for up to 5 seconds if you can.  Rinse and gargle with water when done Only use your albuterol(proair)  as a rescue medication Nexium 40 mg Take 30- 60 min before your first and last meals of the day  GERD  Diet    09/24/2014 f/u ov/[facility] re: asthma f/u  Chief Complaint  Patient presents with  . Follow-up    PFT done today. Pt states that her breathing has improved since the last visit. No new co's today.    Not limited by breathing from desired activities  / no need for saba in any form   No obvious day to day or daytime variabilty or assoc chronic cough or cp or chest  tightness, subjective wheeze overt sinus or hb symptoms. No unusual exp hx or h/o childhood pna/ asthma or knowledge of premature birth.  Sleeping ok without nocturnal  or early am exacerbation  of respiratory  c/o's or need for noct saba. Also denies any obvious fluctuation of symptoms with weather or environmental changes or other aggravating or alleviating factors except as outlined above   Current Medications, Allergies, Complete Past Medical History, Past Surgical History, Family History, and Social History were reviewed in [employer] record.  ROS  The following are not active complaints unless bolded sore throat, dysphagia, dental problems, itching, sneezing,  nasal congestion or excess/ purulent secretions, ear ache,   fever, chills, sweats, unintended wt loss, pleuritic or exertional cp, hemoptysis,  orthopnea pnd or leg swelling, presyncope, palpitations, heartburn, abdominal pain, anorexia, nausea, vomiting, diarrhea  or change in bowel or urinary habits, change in stools or urine, dysuria,hematuria,  rash, arthralgias, visual complaints, headache, numbness weakness or ataxia or problems with walking or coordination,  change in mood/affect or memory.               Objective:   Physical Exam  amb slt cushingnoid  mod hoarse latino female nad  09/24/2014        184  Wt Readings from Last 3 Encounters:  08/12/14 175 lb (79.379 kg)  03/31/14 170 lb (77.111 kg)  03/30/14 170 lb (77.111 kg)    Vital signs reviewed  HEENT: nl dentition, turbinates, and orophanx. Nl external ear canals without cough reflex   NECK :  without JVD/Nodes/TM/ nl carotid upstrokes bilaterally   LUNGS: no acc muscle use, clear to A and P bilaterally without cough on insp or exp maneuvers   CV:  RRR  no s3 or murmur or increase in P2, no edema   ABD:  soft and nontender with nl excursion in the supine position. No bruits or organomegaly, bowel sounds nl  MS:  warm without  deformities, calf tenderness, cyanosis or clubbing  SKIN: warm and dry without lesions    NEURO:  alert, approp, no deficits     I personally reviewed images and agree with radiology impression as follows:  CT chest s contrast   07/27/14 Several indeterminate bilateral pulmonary nodules, largest measuring 6 mm in the right lower lobe. Given risk factors for bronchogenic carcinoma, follow-up chest CT at 6 - 12 months is recommended       Assessment & Plan:

## 2014-09-25 ENCOUNTER — Encounter: Payer: Self-pay | Admitting: Internal Medicine

## 2014-09-25 NOTE — Assessment & Plan Note (Signed)
-   PFT's 09/24/14  wnl except for ERV 19% and non specific reduction in fef25-75   The proper method of use, as well as anticipated side effects, of a metered-dose inhaler are discussed and demonstrated to the patient. Improved effectiveness after extensive coaching during this visit to a level of approximately  75%     I had an extended discussion with the patient reviewing all relevant studies completed to date and  lasting 15 to 20 minutes of a 25 minute visit on the following ongoing concerns:  1) she does not have sign copd  2) any asthma she may have had as been eliminated on dulera 200 and could probably do step down to 100 2bid next visit  3) may of her symptoms may have been related to gerd/ theophylline or obesity > key is to get wt off and keep it off > cal balance issues reviewed     Each maintenance medication was reviewed in detail including most importantly the difference between maintenance and as needed and under what circumstances the prns are to be used.  Please see instructions for details which were reviewed in writing and the patient given a copy.

## 2015-01-05 ENCOUNTER — Other Ambulatory Visit: Payer: Self-pay | Admitting: Internal Medicine

## 2015-01-05 DIAGNOSIS — R918 Other nonspecific abnormal finding of lung field: Secondary | ICD-10-CM

## 2015-01-26 ENCOUNTER — Ambulatory Visit (INDEPENDENT_AMBULATORY_CARE_PROVIDER_SITE_OTHER)
Admission: RE | Admit: 2015-01-26 | Discharge: 2015-01-26 | Disposition: A | Discharge status: Home / Self Care | Payer: Medicare HMO | Source: Ambulatory Visit | Attending: Internal Medicine | Admitting: Internal Medicine

## 2015-01-26 DIAGNOSIS — R918 Other nonspecific abnormal finding of lung field: Secondary | ICD-10-CM

## 2015-01-26 NOTE — Progress Notes (Signed)
Quick Note:  LMTCB ______ 

## 2015-06-01 ENCOUNTER — Telehealth: Payer: Self-pay | Admitting: *Deleted

## 2015-06-01 NOTE — Telephone Encounter (Signed)
-----   Message from Tanda Rockers, MD sent at 01/26/2015  4:52 PM EDT ----- Ct s contrast f/u mpns

## 2015-06-01 NOTE — Telephone Encounter (Signed)
Pt due for CT in December lmomtcb x1

## 2015-06-03 NOTE — Telephone Encounter (Signed)
LMTCB on her spouses number since her mailbox was full

## 2015-06-06 NOTE — Telephone Encounter (Signed)
Called pt VM full, call spouse and LMTCB

## 2015-06-09 ENCOUNTER — Encounter (HOSPITAL_COMMUNITY): Payer: Self-pay | Admitting: *Deleted

## 2015-06-09 ENCOUNTER — Emergency Department (HOSPITAL_COMMUNITY)
Admission: EM | Admit: 2015-06-09 | Discharge: 2015-06-09 | Disposition: A | Discharge status: Home / Self Care | Payer: No Typology Code available for payment source | Attending: Emergency Medicine | Admitting: Emergency Medicine

## 2015-06-09 ENCOUNTER — Encounter: Payer: Self-pay | Admitting: *Deleted

## 2015-06-09 DIAGNOSIS — I10 Essential (primary) hypertension: Secondary | ICD-10-CM | POA: Insufficient documentation

## 2015-06-09 DIAGNOSIS — Y998 Other external cause status: Secondary | ICD-10-CM | POA: Insufficient documentation

## 2015-06-09 DIAGNOSIS — Y92410 Unspecified street and highway as the place of occurrence of the external cause: Secondary | ICD-10-CM | POA: Diagnosis not present

## 2015-06-09 DIAGNOSIS — Z794 Long term (current) use of insulin: Secondary | ICD-10-CM | POA: Diagnosis not present

## 2015-06-09 DIAGNOSIS — Z7984 Long term (current) use of oral hypoglycemic drugs: Secondary | ICD-10-CM | POA: Diagnosis not present

## 2015-06-09 DIAGNOSIS — S199XXA Unspecified injury of neck, initial encounter: Secondary | ICD-10-CM | POA: Diagnosis not present

## 2015-06-09 DIAGNOSIS — K219 Gastro-esophageal reflux disease without esophagitis: Secondary | ICD-10-CM | POA: Insufficient documentation

## 2015-06-09 DIAGNOSIS — Z79899 Other long term (current) drug therapy: Secondary | ICD-10-CM | POA: Diagnosis not present

## 2015-06-09 DIAGNOSIS — Y9389 Activity, other specified: Secondary | ICD-10-CM | POA: Insufficient documentation

## 2015-06-09 DIAGNOSIS — J45909 Unspecified asthma, uncomplicated: Secondary | ICD-10-CM | POA: Diagnosis not present

## 2015-06-09 DIAGNOSIS — S29002A Unspecified injury of muscle and tendon of back wall of thorax, initial encounter: Secondary | ICD-10-CM | POA: Insufficient documentation

## 2015-06-09 DIAGNOSIS — Z8669 Personal history of other diseases of the nervous system and sense organs: Secondary | ICD-10-CM | POA: Insufficient documentation

## 2015-06-09 DIAGNOSIS — M549 Dorsalgia, unspecified: Secondary | ICD-10-CM

## 2015-06-09 DIAGNOSIS — E119 Type 2 diabetes mellitus without complications: Secondary | ICD-10-CM | POA: Diagnosis not present

## 2015-06-09 DIAGNOSIS — Z9849 Cataract extraction status, unspecified eye: Secondary | ICD-10-CM | POA: Insufficient documentation

## 2015-06-09 DIAGNOSIS — Z87891 Personal history of nicotine dependence: Secondary | ICD-10-CM | POA: Insufficient documentation

## 2015-06-09 DIAGNOSIS — M542 Cervicalgia: Secondary | ICD-10-CM

## 2015-06-09 HISTORY — DX: Essential (primary) hypertension: I10

## 2015-06-09 MED ORDER — METHOCARBAMOL 500 MG PO TABS: 500.0000 mg | ORAL_TABLET | Freq: Two times a day (BID) | ORAL | Status: DC | PRN

## 2015-06-09 MED ORDER — NAPROXEN 250 MG PO TABS: 250.0000 mg | ORAL_TABLET | Freq: Two times a day (BID) | ORAL | Status: DC

## 2015-06-09 MED ORDER — ACETAMINOPHEN 325 MG PO TABS
650.0000 mg | ORAL_TABLET | Freq: Once | ORAL | Status: AC
  Administered 2015-06-09: 650 mg via ORAL
  Filled 2015-06-09: qty 2
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
## 2015-06-09 NOTE — Telephone Encounter (Signed)
Letter mailed to the pt asking her to call to set up ct chest for dec 16'.
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
## 2015-07-04 ENCOUNTER — Telehealth: Payer: Self-pay | Admitting: Internal Medicine

## 2015-07-04 DIAGNOSIS — R918 Other nonspecific abnormal finding of lung field: Secondary | ICD-10-CM

## 2015-07-04 NOTE — Telephone Encounter (Signed)
Per 10/26 phone note:  Inge Rise, CMA at 06/01/2015 4:49 PM     Status: Signed       Expand All Collapse All   ----- Message from Tanda Rockers, MD sent at 01/26/2015 4:52 PM EDT ----- Ct s contrast f/u mpns      --  Called spoke with pt. She was fine with setting up CT scan. Order has been placed. Nothing further needed

## 2015-07-28 ENCOUNTER — Ambulatory Visit (INDEPENDENT_AMBULATORY_CARE_PROVIDER_SITE_OTHER)
Admission: RE | Admit: 2015-07-28 | Discharge: 2015-07-28 | Disposition: A | Discharge status: Home / Self Care | Payer: Medicare HMO | Source: Ambulatory Visit | Attending: Internal Medicine | Admitting: Internal Medicine

## 2015-07-28 DIAGNOSIS — R918 Other nonspecific abnormal finding of lung field: Secondary | ICD-10-CM | POA: Diagnosis not present

## 2015-07-29 ENCOUNTER — Telehealth: Payer: Self-pay | Admitting: Internal Medicine

## 2015-07-29 NOTE — Telephone Encounter (Signed)
Patient notified of CT results.  Nothing further needed. 

## 2015-08-07 HISTORY — PX: ABDOMINOPLASTY: SUR9

## 2015-08-15 ENCOUNTER — Telehealth: Payer: Self-pay | Admitting: Internal Medicine

## 2015-08-15 MED ORDER — MOMETASONE FURO-FORMOTEROL FUM 200-5 MCG/ACT IN AERO
INHALATION_SPRAY | RESPIRATORY_TRACT | Status: DC
Start: 2015-08-15 — End: 2015-08-18

## 2015-08-17 DIAGNOSIS — E119 Type 2 diabetes mellitus without complications: Secondary | ICD-10-CM | POA: Diagnosis not present

## 2015-08-17 DIAGNOSIS — D500 Iron deficiency anemia secondary to blood loss (chronic): Secondary | ICD-10-CM | POA: Diagnosis not present

## 2015-08-17 DIAGNOSIS — J45909 Unspecified asthma, uncomplicated: Secondary | ICD-10-CM | POA: Diagnosis not present

## 2015-08-17 DIAGNOSIS — I10 Essential (primary) hypertension: Secondary | ICD-10-CM | POA: Diagnosis not present

## 2015-08-17 DIAGNOSIS — E785 Hyperlipidemia, unspecified: Secondary | ICD-10-CM | POA: Diagnosis not present

## 2015-08-17 DIAGNOSIS — D473 Essential (hemorrhagic) thrombocythemia: Secondary | ICD-10-CM | POA: Diagnosis not present

## 2015-08-17 DIAGNOSIS — E1140 Type 2 diabetes mellitus with diabetic neuropathy, unspecified: Secondary | ICD-10-CM | POA: Diagnosis not present

## 2015-08-17 DIAGNOSIS — R911 Solitary pulmonary nodule: Secondary | ICD-10-CM | POA: Diagnosis not present

## 2015-08-17 DIAGNOSIS — I119 Hypertensive heart disease without heart failure: Secondary | ICD-10-CM | POA: Diagnosis not present

## 2015-08-17 NOTE — Telephone Encounter (Signed)
Pt's dulera refill request has been filled by Sharyn Lull.  Pt needs ov for further refills. lmtcb X1 to schedule appt with MW

## 2015-08-17 NOTE — Telephone Encounter (Signed)
Pt scheduled for OV with MW 08/18/15 at 10am.  Rx refilled 08/15/15 to [facility]. Nothing further needed.

## 2015-08-18 ENCOUNTER — Encounter: Payer: Self-pay | Admitting: Internal Medicine

## 2015-08-18 ENCOUNTER — Ambulatory Visit (INDEPENDENT_AMBULATORY_CARE_PROVIDER_SITE_OTHER): Payer: Medicare HMO | Admitting: Internal Medicine

## 2015-08-18 VITALS — BP 140/82 | HR 91 | Ht 61.0 in | Wt 180.0 lb

## 2015-08-18 DIAGNOSIS — J4530 Mild persistent asthma, uncomplicated: Secondary | ICD-10-CM | POA: Diagnosis not present

## 2015-08-18 DIAGNOSIS — R918 Other nonspecific abnormal finding of lung field: Secondary | ICD-10-CM

## 2015-08-18 MED ORDER — MOMETASONE FURO-FORMOTEROL FUM 100-5 MCG/ACT IN AERO: INHALATION_SPRAY | RESPIRATORY_TRACT | Status: DC

## 2015-08-18 NOTE — Assessment & Plan Note (Signed)
-  The proper method of use, as well as anticipated side effects, of a metered-dose inhaler are discussed and demonstrated to the patient. Improved effectiveness after extensive coaching during this visit to a level of approximately 75 % from a baseline of 50 %   Despite suboptimal technique >>> All goals of chronic asthma control met including optimal function and elimination of symptoms with minimal need for rescue therapy.  Contingencies discussed in full including contacting this office immediately if not controlling the symptoms using the rule of two's.     I had an extended discussion with the patient reviewing all relevant studies completed to date and  lasting 15 to 20 minutes of a 25 minute visit    Each maintenance medication was reviewed in detail including most importantly the difference between maintenance and prns and under what circumstances the prns are to be triggered using an action plan format that is not reflected in the computer generated alphabetically organized AVS.    Please see instructions for details which were reviewed in writing and the patient given a copy highlighting the part that I personally wrote and discussed at today's ov.

## 2015-08-18 NOTE — Assessment & Plan Note (Signed)
-  quit smoking 2007 See CT 07/27/14  Several indeterminate bilateral pulmonary nodules, largest measuring 6 mm in the right lower lobe - rec f/uc CT 01/26/15 ?> no change > rec 6 m f/u = 07/28/15> Stable RIGHT and LEFT lower lobe pulmonary nodules and two new upper lobe nodules likely inflammatory > rec recheck 01/26/16   CT results reviewed with pt >>> Too small for PET or bx, not suspicious enough for excisional bx > really only option for now is follow the Fleischner society guidelines as rec by radiology.   Discussed in detail all the  indications, usual  risks and alternatives  relative to the benefits with patient who agrees to proceed with conservative f/u as outlined  = 6 month f/u for both sets of nodules

## 2015-08-18 NOTE — Patient Instructions (Signed)
Work on inhaler technique:  relax and gently blow all the way out then take a nice smooth deep breath back in, triggering the inhaler at same time you start breathing in.  Hold for up to 5 seconds if you can. Blow out thru nose. Rinse and gargle with water when done    Decrease dulera to 100 2 every 12 hours if do just as well as on the 200 dose fill the prescription  Please see patient coordinator before you leave today  to schedule CT in June 2017 and follow up here the next day

## 2015-08-18 NOTE — Progress Notes (Signed)
Subjective:    Patient ID: Leslie Scott, female    DOB: 04-10-1966    MRN: [id number]    Brief patient profile:  50 yo PR former smoker  with asthma since age 46 on Theophylline ever since better in 2006 breathing while in [place] and reduction to may be once a day but breathing worse in 2014 despite quit smoking 2007 [place] 2013 and referred to pulmonary clinic 08/12/13 by Dr Doristine Section bonsu for eval of lung nodule      History of Present Illness  08/12/2014 1st [facility] office visit/ Leslie Scott  / maintained on advair and theoph Chief Complaint  Patient presents with  . Pulmonary Consult    Referred by Dr. Vista Lawman for eval of pulmonary nodule. Pt states that she was dxed with asthma at age 66. She c/o increased SOB for the past yr.  She states that she gets SOB with "any rapid movement".    last pred x 6 months/ still on theoph with recent [facility] of gerd at [facility]/ has chest tightness better with saba and doe x anything more than slow adls x years rec No more CT scans for 6 months  Stop theophylline and advair  Dulera 200 Take 2 puffs first thing in am and then another 2 puffs about 12 hours later and if happy with it then fill the Rx  Work on inhaler technique:  relax and gently blow all the way out then take a nice smooth deep breath back in, triggering the inhaler at same time you start breathing in.  Hold for up to 5 seconds if you can.  Rinse and gargle with water when done Only use your albuterol(proair)  as a rescue medication Nexium 40 mg Take 30- 60 min before your first and last meals of the day  GERD  Diet    09/24/2014 f/u ov/Leslie Scott re: asthma f/u  Chief Complaint  Patient presents with  . Follow-up    PFT done today. Pt states that her breathing has improved since the last visit. No new co's today.    Not limited by breathing from desired activities  / no need for saba in any form  rec Continue dulera 200 Take 2 puffs first thing in am and then another 2 puffs  about 12 hours later.  Work on Engineer, technical sales technique:  relax and gently blow all the way out then take a nice smooth deep breath back in, triggering the inhaler at same time you start breathing in.  Hold for up to 5 seconds if you can.  Rinse and gargle with water when done    08/18/2015  f/u ov/Leslie Scott re: asthma/ mpns Chief Complaint  Patient presents with  . Follow-up    occ sob noticed more when going up stairs, occ wheezing. denies any cp/tightness.   no need for saba at all/ no noct spells   No obvious day to day or daytime variability or assoc chronic cough or cp or chest tightness,   or overt sinus or hb symptoms. No unusual exp hx or h/o childhood pna/ asthma or knowledge of premature birth.  Sleeping ok without nocturnal  or early am exacerbation  of respiratory  c/o's or need for noct saba. Also denies any obvious fluctuation of symptoms with weather or environmental changes or other aggravating or alleviating factors except as outlined above   Current Medications, Allergies, Complete Past Medical History, Past Surgical History, Family History, and Social History were  reviewed in [facility] record.  ROS  The following are not active complaints unless bolded sore throat, dysphagia, dental problems, itching, sneezing,  nasal congestion or excess/ purulent secretions, ear ache,   fever, chills, sweats, unintended wt loss, classically pleuritic or exertional cp, hemoptysis,  orthopnea pnd or leg swelling, presyncope, palpitations, abdominal pain, anorexia, nausea, vomiting, diarrhea  or change in bowel or bladder habits, change in stools or urine, dysuria,hematuria,  rash, arthralgias, visual complaints, headache, numbness, weakness or ataxia or problems with walking or coordination,  change in mood/affect or memory.               Objective:   Physical Exam  amb slt cushingnoid mod hoarse latino female nad  09/24/2014        184  > 08/18/2015 180  Wt  Readings from Last 3 Encounters:  08/12/14 175 lb (79.379 kg)  03/31/14 170 lb (77.111 kg)  03/30/14 170 lb (77.111 kg)    Vital signs reviewed  HEENT: nl dentition, turbinates, and orophanx. Nl external ear canals without cough reflex   NECK :  without JVD/Nodes/TM/ nl carotid upstrokes bilaterally   LUNGS: no acc muscle use, clear to A and P bilaterally without cough on insp or exp maneuvers   CV:  RRR  no s3 or murmur or increase in P2, no edema   ABD:  soft and nontender with nl excursion in the supine position. No bruits or organomegaly, bowel sounds nl  MS:  warm without deformities, calf tenderness, cyanosis or clubbing  SKIN: warm and dry without lesions    NEURO:  alert, approp, no deficits     I personally reviewed images and agree with radiology impression as follows:  CT chest s contrast  07/28/15   1. Two adjacent pulmonary nodules in the RIGHT upper lobe are new from comparison exams. Larger nodule measures 10 mm. As lesion was not evident on CT 6 months prior, favor a focus of infection / inflammation ; however cannot completely exclude rapidly growing neoplasm. Recommend CT without contrast in 1-3 months. 2. Stable RIGHT and LEFT lower lobe pulmonary nodules over a 12 month interval.     Assessment & Plan:

## 2015-08-24 NOTE — Telephone Encounter (Signed)
Will sign off on message.

## 2015-09-06 DIAGNOSIS — Z01419 Encounter for gynecological examination (general) (routine) without abnormal findings: Secondary | ICD-10-CM | POA: Diagnosis not present

## 2015-09-06 DIAGNOSIS — J45909 Unspecified asthma, uncomplicated: Secondary | ICD-10-CM | POA: Diagnosis not present

## 2015-09-06 DIAGNOSIS — E119 Type 2 diabetes mellitus without complications: Secondary | ICD-10-CM | POA: Diagnosis not present

## 2015-09-06 DIAGNOSIS — Z885 Allergy status to narcotic agent status: Secondary | ICD-10-CM | POA: Diagnosis not present

## 2015-09-06 DIAGNOSIS — Z7951 Long term (current) use of inhaled steroids: Secondary | ICD-10-CM | POA: Diagnosis not present

## 2015-09-06 DIAGNOSIS — Z794 Long term (current) use of insulin: Secondary | ICD-10-CM | POA: Diagnosis not present

## 2015-09-06 DIAGNOSIS — Z87891 Personal history of nicotine dependence: Secondary | ICD-10-CM | POA: Diagnosis not present

## 2015-09-06 DIAGNOSIS — Z79899 Other long term (current) drug therapy: Secondary | ICD-10-CM | POA: Diagnosis not present

## 2015-09-06 DIAGNOSIS — I10 Essential (primary) hypertension: Secondary | ICD-10-CM | POA: Diagnosis not present

## 2015-10-19 ENCOUNTER — Telehealth: Payer: Self-pay | Admitting: Internal Medicine

## 2015-10-19 MED ORDER — ALBUTEROL SULFATE HFA 108 (90 BASE) MCG/ACT IN AERS: 2.0000 | INHALATION_SPRAY | Freq: Four times a day (QID) | RESPIRATORY_TRACT | Status: DC | PRN

## 2015-10-19 NOTE — Telephone Encounter (Signed)
Called and spoke with pt. She states she would like for proair to be sent to the [facility] on [address]. I explained to her that the medication would be sent today. She voiced understanding and had no further questions. Rx sent. Nothing further needed.

## 2015-11-23 DIAGNOSIS — E113492 Type 2 diabetes mellitus with severe nonproliferative diabetic retinopathy without macular edema, left eye: Secondary | ICD-10-CM | POA: Diagnosis not present

## 2015-11-23 DIAGNOSIS — E113412 Type 2 diabetes mellitus with severe nonproliferative diabetic retinopathy with macular edema, left eye: Secondary | ICD-10-CM | POA: Diagnosis not present

## 2015-11-23 DIAGNOSIS — H35032 Hypertensive retinopathy, left eye: Secondary | ICD-10-CM | POA: Diagnosis not present

## 2015-11-23 DIAGNOSIS — H3562 Retinal hemorrhage, left eye: Secondary | ICD-10-CM | POA: Diagnosis not present

## 2016-01-16 ENCOUNTER — Ambulatory Visit (INDEPENDENT_AMBULATORY_CARE_PROVIDER_SITE_OTHER)
Admission: RE | Admit: 2016-01-16 | Discharge: 2016-01-16 | Disposition: A | Discharge status: Home / Self Care | Payer: Commercial Managed Care - HMO | Source: Ambulatory Visit | Attending: Internal Medicine | Admitting: Internal Medicine

## 2016-01-16 DIAGNOSIS — R918 Other nonspecific abnormal finding of lung field: Secondary | ICD-10-CM

## 2016-01-17 ENCOUNTER — Ambulatory Visit: Payer: Medicare HMO | Admitting: Internal Medicine

## 2016-01-17 NOTE — Progress Notes (Signed)
Quick Note:  Spoke with pt and notified of results per Dr. Wert. Pt verbalized understanding and denied any questions.  ______ 

## 2016-01-23 ENCOUNTER — Ambulatory Visit: Payer: Commercial Managed Care - HMO | Admitting: Internal Medicine

## 2016-02-03 ENCOUNTER — Ambulatory Visit: Payer: Commercial Managed Care - HMO | Admitting: Internal Medicine

## 2016-02-03 DIAGNOSIS — E785 Hyperlipidemia, unspecified: Secondary | ICD-10-CM | POA: Diagnosis not present

## 2016-02-03 DIAGNOSIS — I10 Essential (primary) hypertension: Secondary | ICD-10-CM | POA: Diagnosis not present

## 2016-02-03 DIAGNOSIS — D473 Essential (hemorrhagic) thrombocythemia: Secondary | ICD-10-CM | POA: Diagnosis not present

## 2016-02-03 DIAGNOSIS — D500 Iron deficiency anemia secondary to blood loss (chronic): Secondary | ICD-10-CM | POA: Diagnosis not present

## 2016-02-03 DIAGNOSIS — M722 Plantar fascial fibromatosis: Secondary | ICD-10-CM | POA: Diagnosis not present

## 2016-02-03 DIAGNOSIS — E119 Type 2 diabetes mellitus without complications: Secondary | ICD-10-CM | POA: Diagnosis not present

## 2016-02-03 DIAGNOSIS — R911 Solitary pulmonary nodule: Secondary | ICD-10-CM | POA: Diagnosis not present

## 2016-02-03 DIAGNOSIS — J45909 Unspecified asthma, uncomplicated: Secondary | ICD-10-CM | POA: Diagnosis not present

## 2016-02-03 DIAGNOSIS — I119 Hypertensive heart disease without heart failure: Secondary | ICD-10-CM | POA: Diagnosis not present

## 2016-02-24 ENCOUNTER — Ambulatory Visit (INDEPENDENT_AMBULATORY_CARE_PROVIDER_SITE_OTHER): Payer: Commercial Managed Care - HMO | Admitting: Internal Medicine

## 2016-02-24 ENCOUNTER — Encounter: Payer: Self-pay | Admitting: Internal Medicine

## 2016-02-24 VITALS — BP 110/74 | HR 90 | Ht 61.0 in | Wt 172.0 lb

## 2016-02-24 DIAGNOSIS — R918 Other nonspecific abnormal finding of lung field: Secondary | ICD-10-CM | POA: Diagnosis not present

## 2016-02-24 DIAGNOSIS — J4530 Mild persistent asthma, uncomplicated: Secondary | ICD-10-CM

## 2016-02-24 MED ORDER — MOMETASONE FURO-FORMOTEROL FUM 100-5 MCG/ACT IN AERO: INHALATION_SPRAY | RESPIRATORY_TRACT | Status: DC

## 2016-02-24 NOTE — Patient Instructions (Addendum)
Plan A = Automatic =  Dulera 100 Take 2 puffs first thing in am and then another 2 puffs about 12 hours later.   Work on inhaler technique:  relax and gently blow all the way out then take a nice smooth deep breath back in, triggering the inhaler at same time you start breathing in.  Hold for up to 5 seconds if you can. Blow out thru nose. Rinse and gargle with water when done     Plan B = Backup Only use your albuterol (ventolin)  as a rescue medication to be used if you can't catch your breath by resting or doing a relaxed purse lip breathing pattern.  - The less you use it, the better it will work when you need it. - Ok to use the inhaler up to 2 puffs  every 4 hours if you must but call for appointment if use goes up over your usual need - Don't leave home without it !!  (think of it like the spare tire for your car)   Plan C = Crisis - only use your albuterol nebulizer if you first try Plan B and it fails to help > ok to use the nebulizer up to every 4 hours but if start needing it regularly call for immediate appointment    Plan D = Doctor - call me if B and C not adequate  Plan E = ER - go to ER or call 911 if all else fails      If you are satisfied with your treatment plan,  let your doctor know and he/she can either refill your medications or you can return here when your prescription runs out.     If in any way you are not 100% satisfied,  please tell us.  If 100% better, tell your friends!  Pulmonary follow up is as needed

## 2016-02-24 NOTE — Progress Notes (Signed)
Subjective:    Patient ID: Leslie Scott, female    DOB: February 11, 1966    MRN: [id number]    Brief patient profile:  50 yo [place] former smoker  with asthma since age 66 on Theophylline ever since better in 2006 breathing while in [place] and reduction to may be once a day but breathing worse in 2014 despite quit smoking 2007 [place] 2013 and referred to pulmonary clinic 08/12/13 by Dr Doristine Section bonsu for eval of lung nodule      History of Present Illness  08/12/2014 1st [facility] office visit/ Melvyn Novas  / maintained on advair and theoph Chief Complaint  Patient presents with  . Pulmonary Consult    Referred by Dr. Vista Lawman for eval of pulmonary nodule. Pt states that she was dxed with asthma at age 25. She c/o increased SOB for the past yr.  She states that she gets SOB with "any rapid movement".    last pred x 6 months/ still on theoph with [facility] of gerd [facility]/ has chest tightness better with saba and doe x anything more than slow adls x years rec No more CT scans for 6 months  Stop theophylline and advair  Dulera 200 Take 2 puffs first thing in am and then another 2 puffs about 12 hours later and if happy with it then fill the Rx  Work on inhaler technique:  relax and gently blow all the way out then take a nice smooth deep breath back in, triggering the inhaler at same time you start breathing in.  Hold for up to 5 seconds if you can.  Rinse and gargle with water when done Only use your albuterol(proair)  as a rescue medication Nexium 40 mg Take 30- 60 min before your first and last meals of the day  GERD  Diet    09/24/2014 f/u ov/Wert re: asthma f/u  Chief Complaint  Patient presents with  . Follow-up    PFT done today. Pt states that her breathing has improved since the last visit. No new co's today.    Not limited by breathing from desired activities  / no need for saba in any form  rec Continue dulera 200 Take 2 puffs first thing in am and then another 2 puffs  about 12 hours later.  Work on Engineer, technical sales technique:     08/18/2015  f/u ov/Wert re: asthma/ mpns Chief Complaint  Patient presents with  . Follow-up    occ sob noticed more when going up stairs, occ wheezing. denies any cp/tightness.   no need for saba at all/ no noct spells  rec Work on inhaler technique:   Decrease dulera to 100 2 every 12 hours if do just as well as on the 200 dose fill the prescription    02/24/2016  f/u ov/Wert re: asthma/ happy with dulera 200 one twice  Daily (misunderstood instructions Chief Complaint  Patient presents with  . Follow-up    Breathing is doing well on Dulera 100. She has not had to use albuterol inhaler or neb. No new co's today.    Not limited by breathing from desired activities    No obvious day to day or daytime variability or assoc chronic cough or cp or chest tightness,   or overt sinus or hb symptoms. No unusual exp hx or h/o childhood pna/ asthma or knowledge of premature birth.  Sleeping ok without nocturnal  or early am exacerbation  of respiratory  c/o's or need for noct saba. Also denies any obvious fluctuation of symptoms with weather or environmental changes or other aggravating or alleviating factors except as outlined above   Current Medications, Allergies, Complete Past Medical History, Past Surgical History, Family History, and Social History were reviewed in [facility] record.  ROS  The following are not active complaints unless bolded sore throat, dysphagia, dental problems, itching, sneezing,  nasal congestion or excess/ purulent secretions, ear ache,   fever, chills, sweats, unintended wt loss, classically pleuritic or exertional cp, hemoptysis,  orthopnea pnd or leg swelling, presyncope, palpitations, abdominal pain, anorexia, nausea, vomiting, diarrhea  or change in bowel or bladder habits, change in stools or urine, dysuria,hematuria,  rash, arthralgias, visual complaints, headache,  numbness, weakness or ataxia or problems with walking or coordination,  change in mood/affect or memory.               Objective:   Physical Exam  amb slt cushingnoid mod hoarse latino female nad  09/24/2014        184  > 08/18/2015 180 > 02/24/2016  172     08/12/14 175 lb (79.379 kg)  03/31/14 170 lb (77.111 kg)  03/30/14 170 lb (77.111 kg)    Vital signs reviewed  HEENT: nl dentition, turbinates, and orophanx. Nl external ear canals without cough reflex   NECK :  without JVD/Nodes/TM/ nl carotid upstrokes bilaterally   LUNGS: no acc muscle use, clear to A and P bilaterally without cough on insp or exp maneuvers   CV:  RRR  no s3 or murmur or increase in P2, no edema   ABD:  soft and nontender with nl excursion in the supine position. No bruits or organomegaly, bowel sounds nl  MS:  warm without deformities, calf tenderness, cyanosis or clubbing  SKIN: warm and dry without lesions    NEURO:  alert, approp, no deficits     I personally reviewed images and agree with radiology impression as follows:  repeat ct 01/16/16 > 1. Resolution of the 2 new right upper lobe pulmonary nodules seen on prior study 07/28/2015, indicative that they were infectious/inflammatory on the prior study. 2. Multiple other tiny pulmonary nodules scattered throughout the lower lungs, largest of which measures only 5 mm in the right lower lobe. These findings are stable compared to prior study 07/27/2014, and considered benign requiring no future imaging follow.     Assessment & Plan:

## 2016-02-26 NOTE — Assessment & Plan Note (Signed)
-  quit smoking 2007 See CT 07/27/14  Several indeterminate bilateral pulmonary nodules, largest measuring 6 mm in the right lower lobe - rec f/uc CT 01/26/15 ?> no change > rec 6 m f/u = 07/28/15> Stable RIGHT and LEFT lower lobe pulmonary nodules and two new upper lobe nodules likely inflammatory  - repeat ct 01/16/16 > 1. Resolution of the 2 new right upper lobe pulmonary nodules seen on prior study 07/28/2015, indicative that they were infectious/inflammatory on the prior study. 2. Multiple other tiny pulmonary nodules scattered throughout the lower lungs, largest of which measures only 5 mm in the right lower lobe. These findings are stable compared to prior study 07/27/2014, and considered benign requiring no future imaging follow.  Now out 10 years from last smoking and will not meet criteria for the lung cancer sreening for 3 more years at which point she's be out 13 years and very low but not no risk > advised further f/u not required  but eligible then if desires   Low-dose CT lung cancer screening is recommended for patients who are 73-43 years of age with a 30+ pack-year history of smoking, and who are currently smoking or quit <=15 years ago.

## 2016-02-26 NOTE — Assessment & Plan Note (Signed)
-  PFT's 09/24/14  wnl except for ERV 19% and non specific reduction in [id number]  -08/18/2015    try step down to dulera 100 > took 200 one bid instead - 02/24/2016  After extensive coaching HFA effectiveness =    75% try dulea 100 2bid   I had an extended final summary discussion with the patient reviewing all relevant studies completed to date and  lasting 15 to 20 minutes of a 25 minute visit on the following issues:    1) despite suboptimal hfa/ All goals of chronic asthma control met including optimal function and elimination of symptoms with minimal need for rescue therapy.  Contingencies discussed in full including contacting this office immediately if not controlling the symptoms using the rule of two's.     2) given limited hfa technique and lack of equivalency between the 200 one bid and the 100 2 bid I strongly favor sticking with the standard rx = 100 2bid   3) Each maintenance medication was reviewed in detail including most importantly the difference between maintenance and as needed and under what circumstances the prns are to be used.  Please see instructions for details which were reviewed in writing and the patient given a copy.    Pulmonary f/u can be prn

## 2016-03-20 DIAGNOSIS — E113412 Type 2 diabetes mellitus with severe nonproliferative diabetic retinopathy with macular edema, left eye: Secondary | ICD-10-CM | POA: Diagnosis not present

## 2016-03-20 DIAGNOSIS — H3562 Retinal hemorrhage, left eye: Secondary | ICD-10-CM | POA: Diagnosis not present

## 2016-03-20 DIAGNOSIS — H35032 Hypertensive retinopathy, left eye: Secondary | ICD-10-CM | POA: Diagnosis not present

## 2016-03-20 DIAGNOSIS — E113492 Type 2 diabetes mellitus with severe nonproliferative diabetic retinopathy without macular edema, left eye: Secondary | ICD-10-CM | POA: Diagnosis not present

## 2016-04-16 DIAGNOSIS — R911 Solitary pulmonary nodule: Secondary | ICD-10-CM | POA: Diagnosis not present

## 2016-04-16 DIAGNOSIS — I119 Hypertensive heart disease without heart failure: Secondary | ICD-10-CM | POA: Diagnosis not present

## 2016-04-16 DIAGNOSIS — D500 Iron deficiency anemia secondary to blood loss (chronic): Secondary | ICD-10-CM | POA: Diagnosis not present

## 2016-04-16 DIAGNOSIS — J45909 Unspecified asthma, uncomplicated: Secondary | ICD-10-CM | POA: Diagnosis not present

## 2016-04-16 DIAGNOSIS — E1140 Type 2 diabetes mellitus with diabetic neuropathy, unspecified: Secondary | ICD-10-CM | POA: Diagnosis not present

## 2016-04-16 DIAGNOSIS — E119 Type 2 diabetes mellitus without complications: Secondary | ICD-10-CM | POA: Diagnosis not present

## 2016-04-16 DIAGNOSIS — K219 Gastro-esophageal reflux disease without esophagitis: Secondary | ICD-10-CM | POA: Diagnosis not present

## 2016-04-16 DIAGNOSIS — I10 Essential (primary) hypertension: Secondary | ICD-10-CM | POA: Diagnosis not present

## 2016-04-16 DIAGNOSIS — E785 Hyperlipidemia, unspecified: Secondary | ICD-10-CM | POA: Diagnosis not present

## 2016-04-16 DIAGNOSIS — D473 Essential (hemorrhagic) thrombocythemia: Secondary | ICD-10-CM | POA: Diagnosis not present

## 2016-04-16 DIAGNOSIS — Z0110 Encounter for examination of ears and hearing without abnormal findings: Secondary | ICD-10-CM | POA: Diagnosis not present

## 2016-07-14 ENCOUNTER — Other Ambulatory Visit: Payer: Self-pay | Admitting: Internal Medicine

## 2016-07-23 DIAGNOSIS — H35032 Hypertensive retinopathy, left eye: Secondary | ICD-10-CM | POA: Diagnosis not present

## 2016-07-23 DIAGNOSIS — E113492 Type 2 diabetes mellitus with severe nonproliferative diabetic retinopathy without macular edema, left eye: Secondary | ICD-10-CM | POA: Diagnosis not present

## 2016-07-23 DIAGNOSIS — E113412 Type 2 diabetes mellitus with severe nonproliferative diabetic retinopathy with macular edema, left eye: Secondary | ICD-10-CM | POA: Diagnosis not present

## 2016-07-23 DIAGNOSIS — H3562 Retinal hemorrhage, left eye: Secondary | ICD-10-CM | POA: Diagnosis not present

## 2016-07-26 DIAGNOSIS — Z0000 Encounter for general adult medical examination without abnormal findings: Secondary | ICD-10-CM | POA: Diagnosis not present

## 2016-07-26 DIAGNOSIS — E119 Type 2 diabetes mellitus without complications: Secondary | ICD-10-CM | POA: Diagnosis not present

## 2016-07-26 DIAGNOSIS — E785 Hyperlipidemia, unspecified: Secondary | ICD-10-CM | POA: Diagnosis not present

## 2016-07-26 DIAGNOSIS — R911 Solitary pulmonary nodule: Secondary | ICD-10-CM | POA: Diagnosis not present

## 2016-07-26 DIAGNOSIS — J45909 Unspecified asthma, uncomplicated: Secondary | ICD-10-CM | POA: Diagnosis not present

## 2016-07-26 DIAGNOSIS — Z23 Encounter for immunization: Secondary | ICD-10-CM | POA: Diagnosis not present

## 2016-07-26 DIAGNOSIS — D500 Iron deficiency anemia secondary to blood loss (chronic): Secondary | ICD-10-CM | POA: Diagnosis not present

## 2016-07-26 DIAGNOSIS — I119 Hypertensive heart disease without heart failure: Secondary | ICD-10-CM | POA: Diagnosis not present

## 2016-07-26 DIAGNOSIS — D473 Essential (hemorrhagic) thrombocythemia: Secondary | ICD-10-CM | POA: Diagnosis not present

## 2016-07-26 DIAGNOSIS — I10 Essential (primary) hypertension: Secondary | ICD-10-CM | POA: Diagnosis not present

## 2016-07-26 DIAGNOSIS — E1140 Type 2 diabetes mellitus with diabetic neuropathy, unspecified: Secondary | ICD-10-CM | POA: Diagnosis not present

## 2016-08-14 DIAGNOSIS — M722 Plantar fascial fibromatosis: Secondary | ICD-10-CM | POA: Diagnosis not present

## 2016-08-28 DIAGNOSIS — M722 Plantar fascial fibromatosis: Secondary | ICD-10-CM | POA: Diagnosis not present

## 2016-09-17 DIAGNOSIS — R262 Difficulty in walking, not elsewhere classified: Secondary | ICD-10-CM | POA: Diagnosis not present

## 2016-09-17 DIAGNOSIS — M629 Disorder of muscle, unspecified: Secondary | ICD-10-CM | POA: Diagnosis not present

## 2016-09-17 DIAGNOSIS — M722 Plantar fascial fibromatosis: Secondary | ICD-10-CM | POA: Diagnosis not present

## 2016-09-17 DIAGNOSIS — M25571 Pain in right ankle and joints of right foot: Secondary | ICD-10-CM | POA: Diagnosis not present

## 2016-09-18 DIAGNOSIS — L910 Hypertrophic scar: Secondary | ICD-10-CM | POA: Diagnosis not present

## 2016-09-20 DIAGNOSIS — M722 Plantar fascial fibromatosis: Secondary | ICD-10-CM | POA: Diagnosis not present

## 2016-09-20 DIAGNOSIS — R262 Difficulty in walking, not elsewhere classified: Secondary | ICD-10-CM | POA: Diagnosis not present

## 2016-09-20 DIAGNOSIS — M629 Disorder of muscle, unspecified: Secondary | ICD-10-CM | POA: Diagnosis not present

## 2016-09-20 DIAGNOSIS — M25571 Pain in right ankle and joints of right foot: Secondary | ICD-10-CM | POA: Diagnosis not present

## 2016-09-24 DIAGNOSIS — M25571 Pain in right ankle and joints of right foot: Secondary | ICD-10-CM | POA: Diagnosis not present

## 2016-09-24 DIAGNOSIS — R262 Difficulty in walking, not elsewhere classified: Secondary | ICD-10-CM | POA: Diagnosis not present

## 2016-09-24 DIAGNOSIS — M722 Plantar fascial fibromatosis: Secondary | ICD-10-CM | POA: Diagnosis not present

## 2016-09-24 DIAGNOSIS — M629 Disorder of muscle, unspecified: Secondary | ICD-10-CM | POA: Diagnosis not present

## 2016-09-25 DIAGNOSIS — E119 Type 2 diabetes mellitus without complications: Secondary | ICD-10-CM | POA: Diagnosis not present

## 2016-09-25 DIAGNOSIS — M722 Plantar fascial fibromatosis: Secondary | ICD-10-CM | POA: Diagnosis not present

## 2016-09-25 DIAGNOSIS — M7661 Achilles tendinitis, right leg: Secondary | ICD-10-CM | POA: Diagnosis not present

## 2016-09-25 DIAGNOSIS — Z79899 Other long term (current) drug therapy: Secondary | ICD-10-CM | POA: Diagnosis not present

## 2016-09-25 DIAGNOSIS — Z01812 Encounter for preprocedural laboratory examination: Secondary | ICD-10-CM | POA: Diagnosis not present

## 2016-10-01 DIAGNOSIS — R911 Solitary pulmonary nodule: Secondary | ICD-10-CM | POA: Diagnosis not present

## 2016-10-01 DIAGNOSIS — E119 Type 2 diabetes mellitus without complications: Secondary | ICD-10-CM | POA: Diagnosis not present

## 2016-10-01 DIAGNOSIS — E785 Hyperlipidemia, unspecified: Secondary | ICD-10-CM | POA: Diagnosis not present

## 2016-10-01 DIAGNOSIS — I119 Hypertensive heart disease without heart failure: Secondary | ICD-10-CM | POA: Diagnosis not present

## 2016-10-01 DIAGNOSIS — G5601 Carpal tunnel syndrome, right upper limb: Secondary | ICD-10-CM | POA: Diagnosis not present

## 2016-10-01 DIAGNOSIS — Z0000 Encounter for general adult medical examination without abnormal findings: Secondary | ICD-10-CM | POA: Diagnosis not present

## 2016-10-01 DIAGNOSIS — J45909 Unspecified asthma, uncomplicated: Secondary | ICD-10-CM | POA: Diagnosis not present

## 2016-10-01 DIAGNOSIS — I10 Essential (primary) hypertension: Secondary | ICD-10-CM | POA: Diagnosis not present

## 2016-10-01 DIAGNOSIS — K219 Gastro-esophageal reflux disease without esophagitis: Secondary | ICD-10-CM | POA: Diagnosis not present

## 2016-10-02 DIAGNOSIS — M722 Plantar fascial fibromatosis: Secondary | ICD-10-CM | POA: Diagnosis not present

## 2016-10-02 DIAGNOSIS — M7751 Other enthesopathy of right foot: Secondary | ICD-10-CM | POA: Diagnosis not present

## 2016-10-02 DIAGNOSIS — M792 Neuralgia and neuritis, unspecified: Secondary | ICD-10-CM | POA: Diagnosis not present

## 2016-10-03 DIAGNOSIS — M25571 Pain in right ankle and joints of right foot: Secondary | ICD-10-CM | POA: Diagnosis not present

## 2016-10-03 DIAGNOSIS — R262 Difficulty in walking, not elsewhere classified: Secondary | ICD-10-CM | POA: Diagnosis not present

## 2016-10-03 DIAGNOSIS — M722 Plantar fascial fibromatosis: Secondary | ICD-10-CM | POA: Diagnosis not present

## 2016-10-03 DIAGNOSIS — M629 Disorder of muscle, unspecified: Secondary | ICD-10-CM | POA: Diagnosis not present

## 2016-10-10 DIAGNOSIS — R69 Illness, unspecified: Secondary | ICD-10-CM | POA: Diagnosis not present

## 2016-10-11 DIAGNOSIS — Z01419 Encounter for gynecological examination (general) (routine) without abnormal findings: Secondary | ICD-10-CM | POA: Diagnosis not present

## 2016-10-23 DIAGNOSIS — M722 Plantar fascial fibromatosis: Secondary | ICD-10-CM | POA: Diagnosis not present

## 2016-10-26 DIAGNOSIS — N393 Stress incontinence (female) (male): Secondary | ICD-10-CM | POA: Diagnosis not present

## 2016-11-20 DIAGNOSIS — Z1231 Encounter for screening mammogram for malignant neoplasm of breast: Secondary | ICD-10-CM | POA: Diagnosis not present

## 2016-11-22 DIAGNOSIS — K219 Gastro-esophageal reflux disease without esophagitis: Secondary | ICD-10-CM | POA: Diagnosis not present

## 2016-11-22 DIAGNOSIS — I119 Hypertensive heart disease without heart failure: Secondary | ICD-10-CM | POA: Diagnosis not present

## 2016-11-22 DIAGNOSIS — J45909 Unspecified asthma, uncomplicated: Secondary | ICD-10-CM | POA: Diagnosis not present

## 2016-11-22 DIAGNOSIS — E119 Type 2 diabetes mellitus without complications: Secondary | ICD-10-CM | POA: Diagnosis not present

## 2016-11-22 DIAGNOSIS — E785 Hyperlipidemia, unspecified: Secondary | ICD-10-CM | POA: Diagnosis not present

## 2016-11-22 DIAGNOSIS — D500 Iron deficiency anemia secondary to blood loss (chronic): Secondary | ICD-10-CM | POA: Diagnosis not present

## 2016-11-22 DIAGNOSIS — R911 Solitary pulmonary nodule: Secondary | ICD-10-CM | POA: Diagnosis not present

## 2016-11-22 DIAGNOSIS — E1140 Type 2 diabetes mellitus with diabetic neuropathy, unspecified: Secondary | ICD-10-CM | POA: Diagnosis not present

## 2016-11-22 DIAGNOSIS — I10 Essential (primary) hypertension: Secondary | ICD-10-CM | POA: Diagnosis not present

## 2016-11-27 DIAGNOSIS — R928 Other abnormal and inconclusive findings on diagnostic imaging of breast: Secondary | ICD-10-CM | POA: Diagnosis not present

## 2016-11-27 DIAGNOSIS — R922 Inconclusive mammogram: Secondary | ICD-10-CM | POA: Diagnosis not present

## 2016-12-26 DIAGNOSIS — N393 Stress incontinence (female) (male): Secondary | ICD-10-CM | POA: Diagnosis not present

## 2017-01-02 ENCOUNTER — Other Ambulatory Visit: Payer: Self-pay | Admitting: Urology

## 2017-01-04 DIAGNOSIS — I10 Essential (primary) hypertension: Secondary | ICD-10-CM | POA: Diagnosis not present

## 2017-01-04 DIAGNOSIS — J45909 Unspecified asthma, uncomplicated: Secondary | ICD-10-CM | POA: Diagnosis not present

## 2017-01-04 DIAGNOSIS — R911 Solitary pulmonary nodule: Secondary | ICD-10-CM | POA: Diagnosis not present

## 2017-01-04 DIAGNOSIS — R300 Dysuria: Secondary | ICD-10-CM | POA: Diagnosis not present

## 2017-01-04 DIAGNOSIS — I119 Hypertensive heart disease without heart failure: Secondary | ICD-10-CM | POA: Diagnosis not present

## 2017-01-04 DIAGNOSIS — E785 Hyperlipidemia, unspecified: Secondary | ICD-10-CM | POA: Diagnosis not present

## 2017-01-04 DIAGNOSIS — E1140 Type 2 diabetes mellitus with diabetic neuropathy, unspecified: Secondary | ICD-10-CM | POA: Diagnosis not present

## 2017-01-04 DIAGNOSIS — D500 Iron deficiency anemia secondary to blood loss (chronic): Secondary | ICD-10-CM | POA: Diagnosis not present

## 2017-01-04 DIAGNOSIS — E119 Type 2 diabetes mellitus without complications: Secondary | ICD-10-CM | POA: Diagnosis not present

## 2017-01-28 DIAGNOSIS — H3562 Retinal hemorrhage, left eye: Secondary | ICD-10-CM | POA: Diagnosis not present

## 2017-01-28 DIAGNOSIS — E113412 Type 2 diabetes mellitus with severe nonproliferative diabetic retinopathy with macular edema, left eye: Secondary | ICD-10-CM | POA: Diagnosis not present

## 2017-01-28 DIAGNOSIS — E113492 Type 2 diabetes mellitus with severe nonproliferative diabetic retinopathy without macular edema, left eye: Secondary | ICD-10-CM | POA: Diagnosis not present

## 2017-01-28 DIAGNOSIS — H35032 Hypertensive retinopathy, left eye: Secondary | ICD-10-CM | POA: Diagnosis not present

## 2017-02-18 ENCOUNTER — Ambulatory Visit (HOSPITAL_BASED_OUTPATIENT_CLINIC_OR_DEPARTMENT_OTHER): Admit: 2017-02-18 | Payer: Commercial Managed Care - HMO | Admitting: Urology

## 2017-02-18 ENCOUNTER — Encounter (HOSPITAL_BASED_OUTPATIENT_CLINIC_OR_DEPARTMENT_OTHER): Payer: Self-pay

## 2017-02-18 SURGERY — CREATION, PUBOVAGINAL SLING
Anesthesia: General

## 2017-02-21 DIAGNOSIS — I10 Essential (primary) hypertension: Secondary | ICD-10-CM | POA: Diagnosis not present

## 2017-02-21 DIAGNOSIS — E119 Type 2 diabetes mellitus without complications: Secondary | ICD-10-CM | POA: Diagnosis not present

## 2017-02-21 DIAGNOSIS — I119 Hypertensive heart disease without heart failure: Secondary | ICD-10-CM | POA: Diagnosis not present

## 2017-02-21 DIAGNOSIS — E1140 Type 2 diabetes mellitus with diabetic neuropathy, unspecified: Secondary | ICD-10-CM | POA: Diagnosis not present

## 2017-02-21 DIAGNOSIS — D500 Iron deficiency anemia secondary to blood loss (chronic): Secondary | ICD-10-CM | POA: Diagnosis not present

## 2017-02-21 DIAGNOSIS — E785 Hyperlipidemia, unspecified: Secondary | ICD-10-CM | POA: Diagnosis not present

## 2017-02-21 DIAGNOSIS — K219 Gastro-esophageal reflux disease without esophagitis: Secondary | ICD-10-CM | POA: Diagnosis not present

## 2017-02-21 DIAGNOSIS — R911 Solitary pulmonary nodule: Secondary | ICD-10-CM | POA: Diagnosis not present

## 2017-02-21 DIAGNOSIS — J45909 Unspecified asthma, uncomplicated: Secondary | ICD-10-CM | POA: Diagnosis not present

## 2017-03-12 DIAGNOSIS — I119 Hypertensive heart disease without heart failure: Secondary | ICD-10-CM | POA: Diagnosis not present

## 2017-03-12 DIAGNOSIS — E119 Type 2 diabetes mellitus without complications: Secondary | ICD-10-CM | POA: Diagnosis not present

## 2017-03-12 DIAGNOSIS — J45909 Unspecified asthma, uncomplicated: Secondary | ICD-10-CM | POA: Diagnosis not present

## 2017-03-12 DIAGNOSIS — D500 Iron deficiency anemia secondary to blood loss (chronic): Secondary | ICD-10-CM | POA: Diagnosis not present

## 2017-03-12 DIAGNOSIS — I10 Essential (primary) hypertension: Secondary | ICD-10-CM | POA: Diagnosis not present

## 2017-03-12 DIAGNOSIS — E785 Hyperlipidemia, unspecified: Secondary | ICD-10-CM | POA: Diagnosis not present

## 2017-03-12 DIAGNOSIS — E1140 Type 2 diabetes mellitus with diabetic neuropathy, unspecified: Secondary | ICD-10-CM | POA: Diagnosis not present

## 2017-03-12 DIAGNOSIS — K219 Gastro-esophageal reflux disease without esophagitis: Secondary | ICD-10-CM | POA: Diagnosis not present

## 2017-03-12 DIAGNOSIS — R911 Solitary pulmonary nodule: Secondary | ICD-10-CM | POA: Diagnosis not present

## 2017-03-13 DIAGNOSIS — N6002 Solitary cyst of left breast: Secondary | ICD-10-CM | POA: Diagnosis not present

## 2017-04-29 DIAGNOSIS — K219 Gastro-esophageal reflux disease without esophagitis: Secondary | ICD-10-CM | POA: Diagnosis not present

## 2017-04-29 DIAGNOSIS — D500 Iron deficiency anemia secondary to blood loss (chronic): Secondary | ICD-10-CM | POA: Diagnosis not present

## 2017-04-29 DIAGNOSIS — Z0000 Encounter for general adult medical examination without abnormal findings: Secondary | ICD-10-CM | POA: Diagnosis not present

## 2017-04-29 DIAGNOSIS — E785 Hyperlipidemia, unspecified: Secondary | ICD-10-CM | POA: Diagnosis not present

## 2017-04-29 DIAGNOSIS — J45909 Unspecified asthma, uncomplicated: Secondary | ICD-10-CM | POA: Diagnosis not present

## 2017-04-29 DIAGNOSIS — Z136 Encounter for screening for cardiovascular disorders: Secondary | ICD-10-CM | POA: Diagnosis not present

## 2017-04-29 DIAGNOSIS — Z0110 Encounter for examination of ears and hearing without abnormal findings: Secondary | ICD-10-CM | POA: Diagnosis not present

## 2017-04-29 DIAGNOSIS — E1140 Type 2 diabetes mellitus with diabetic neuropathy, unspecified: Secondary | ICD-10-CM | POA: Diagnosis not present

## 2017-04-29 DIAGNOSIS — I119 Hypertensive heart disease without heart failure: Secondary | ICD-10-CM | POA: Diagnosis not present

## 2017-04-29 DIAGNOSIS — H539 Unspecified visual disturbance: Secondary | ICD-10-CM | POA: Diagnosis not present

## 2017-04-29 DIAGNOSIS — E119 Type 2 diabetes mellitus without complications: Secondary | ICD-10-CM | POA: Diagnosis not present

## 2017-04-29 DIAGNOSIS — I10 Essential (primary) hypertension: Secondary | ICD-10-CM | POA: Diagnosis not present

## 2017-05-31 DIAGNOSIS — L03032 Cellulitis of left toe: Secondary | ICD-10-CM | POA: Diagnosis not present

## 2017-06-17 DIAGNOSIS — S91312A Laceration without foreign body, left foot, initial encounter: Secondary | ICD-10-CM | POA: Diagnosis not present

## 2017-06-17 DIAGNOSIS — I119 Hypertensive heart disease without heart failure: Secondary | ICD-10-CM | POA: Diagnosis not present

## 2017-06-17 DIAGNOSIS — J45909 Unspecified asthma, uncomplicated: Secondary | ICD-10-CM | POA: Diagnosis not present

## 2017-06-17 DIAGNOSIS — M79641 Pain in right hand: Secondary | ICD-10-CM | POA: Diagnosis not present

## 2017-06-17 DIAGNOSIS — E1140 Type 2 diabetes mellitus with diabetic neuropathy, unspecified: Secondary | ICD-10-CM | POA: Diagnosis not present

## 2017-06-17 DIAGNOSIS — D500 Iron deficiency anemia secondary to blood loss (chronic): Secondary | ICD-10-CM | POA: Diagnosis not present

## 2017-06-17 DIAGNOSIS — I10 Essential (primary) hypertension: Secondary | ICD-10-CM | POA: Diagnosis not present

## 2017-06-17 DIAGNOSIS — E119 Type 2 diabetes mellitus without complications: Secondary | ICD-10-CM | POA: Diagnosis not present

## 2017-06-17 DIAGNOSIS — E785 Hyperlipidemia, unspecified: Secondary | ICD-10-CM | POA: Diagnosis not present

## 2017-07-03 DIAGNOSIS — M79644 Pain in right finger(s): Secondary | ICD-10-CM | POA: Diagnosis not present

## 2017-08-08 DIAGNOSIS — R350 Frequency of micturition: Secondary | ICD-10-CM | POA: Diagnosis not present

## 2017-08-08 DIAGNOSIS — N3941 Urge incontinence: Secondary | ICD-10-CM | POA: Diagnosis not present

## 2017-08-08 DIAGNOSIS — N393 Stress incontinence (female) (male): Secondary | ICD-10-CM | POA: Diagnosis not present

## 2017-08-08 DIAGNOSIS — M6289 Other specified disorders of muscle: Secondary | ICD-10-CM | POA: Diagnosis not present

## 2017-08-08 DIAGNOSIS — M6281 Muscle weakness (generalized): Secondary | ICD-10-CM | POA: Diagnosis not present

## 2017-08-19 DIAGNOSIS — M62838 Other muscle spasm: Secondary | ICD-10-CM | POA: Diagnosis not present

## 2017-08-19 DIAGNOSIS — M6281 Muscle weakness (generalized): Secondary | ICD-10-CM | POA: Diagnosis not present

## 2017-08-19 DIAGNOSIS — R350 Frequency of micturition: Secondary | ICD-10-CM | POA: Diagnosis not present

## 2017-08-19 DIAGNOSIS — N393 Stress incontinence (female) (male): Secondary | ICD-10-CM | POA: Diagnosis not present

## 2017-08-19 DIAGNOSIS — N3941 Urge incontinence: Secondary | ICD-10-CM | POA: Diagnosis not present

## 2017-09-02 DIAGNOSIS — N393 Stress incontinence (female) (male): Secondary | ICD-10-CM | POA: Diagnosis not present

## 2017-09-02 DIAGNOSIS — M62838 Other muscle spasm: Secondary | ICD-10-CM | POA: Diagnosis not present

## 2017-09-02 DIAGNOSIS — D500 Iron deficiency anemia secondary to blood loss (chronic): Secondary | ICD-10-CM | POA: Diagnosis not present

## 2017-09-02 DIAGNOSIS — J45909 Unspecified asthma, uncomplicated: Secondary | ICD-10-CM | POA: Diagnosis not present

## 2017-09-02 DIAGNOSIS — N3941 Urge incontinence: Secondary | ICD-10-CM | POA: Diagnosis not present

## 2017-09-02 DIAGNOSIS — M6281 Muscle weakness (generalized): Secondary | ICD-10-CM | POA: Diagnosis not present

## 2017-09-02 DIAGNOSIS — Z0110 Encounter for examination of ears and hearing without abnormal findings: Secondary | ICD-10-CM | POA: Diagnosis not present

## 2017-09-02 DIAGNOSIS — I10 Essential (primary) hypertension: Secondary | ICD-10-CM | POA: Diagnosis not present

## 2017-09-02 DIAGNOSIS — Z0000 Encounter for general adult medical examination without abnormal findings: Secondary | ICD-10-CM | POA: Diagnosis not present

## 2017-09-02 DIAGNOSIS — E119 Type 2 diabetes mellitus without complications: Secondary | ICD-10-CM | POA: Diagnosis not present

## 2017-09-02 DIAGNOSIS — E785 Hyperlipidemia, unspecified: Secondary | ICD-10-CM | POA: Diagnosis not present

## 2017-09-02 DIAGNOSIS — R350 Frequency of micturition: Secondary | ICD-10-CM | POA: Diagnosis not present

## 2017-09-02 DIAGNOSIS — I119 Hypertensive heart disease without heart failure: Secondary | ICD-10-CM | POA: Diagnosis not present

## 2017-09-02 DIAGNOSIS — E1140 Type 2 diabetes mellitus with diabetic neuropathy, unspecified: Secondary | ICD-10-CM | POA: Diagnosis not present

## 2017-09-02 DIAGNOSIS — Z0100 Encounter for examination of eyes and vision without abnormal findings: Secondary | ICD-10-CM | POA: Diagnosis not present

## 2017-09-17 DIAGNOSIS — N3941 Urge incontinence: Secondary | ICD-10-CM | POA: Diagnosis not present

## 2017-09-17 DIAGNOSIS — N393 Stress incontinence (female) (male): Secondary | ICD-10-CM | POA: Diagnosis not present

## 2017-09-17 DIAGNOSIS — M6281 Muscle weakness (generalized): Secondary | ICD-10-CM | POA: Diagnosis not present

## 2017-09-17 DIAGNOSIS — R350 Frequency of micturition: Secondary | ICD-10-CM | POA: Diagnosis not present

## 2017-09-17 DIAGNOSIS — M62838 Other muscle spasm: Secondary | ICD-10-CM | POA: Diagnosis not present

## 2017-10-01 DIAGNOSIS — E1140 Type 2 diabetes mellitus with diabetic neuropathy, unspecified: Secondary | ICD-10-CM | POA: Diagnosis not present

## 2017-10-01 DIAGNOSIS — K219 Gastro-esophageal reflux disease without esophagitis: Secondary | ICD-10-CM | POA: Diagnosis not present

## 2017-10-01 DIAGNOSIS — D500 Iron deficiency anemia secondary to blood loss (chronic): Secondary | ICD-10-CM | POA: Diagnosis not present

## 2017-10-01 DIAGNOSIS — E785 Hyperlipidemia, unspecified: Secondary | ICD-10-CM | POA: Diagnosis not present

## 2017-10-01 DIAGNOSIS — I119 Hypertensive heart disease without heart failure: Secondary | ICD-10-CM | POA: Diagnosis not present

## 2017-10-01 DIAGNOSIS — E119 Type 2 diabetes mellitus without complications: Secondary | ICD-10-CM | POA: Diagnosis not present

## 2017-10-01 DIAGNOSIS — J45909 Unspecified asthma, uncomplicated: Secondary | ICD-10-CM | POA: Diagnosis not present

## 2017-10-01 DIAGNOSIS — Z0000 Encounter for general adult medical examination without abnormal findings: Secondary | ICD-10-CM | POA: Diagnosis not present

## 2017-10-01 DIAGNOSIS — I10 Essential (primary) hypertension: Secondary | ICD-10-CM | POA: Diagnosis not present

## 2017-10-21 DIAGNOSIS — E119 Type 2 diabetes mellitus without complications: Secondary | ICD-10-CM | POA: Diagnosis not present

## 2017-10-21 DIAGNOSIS — J45909 Unspecified asthma, uncomplicated: Secondary | ICD-10-CM | POA: Diagnosis not present

## 2017-10-21 DIAGNOSIS — E1140 Type 2 diabetes mellitus with diabetic neuropathy, unspecified: Secondary | ICD-10-CM | POA: Diagnosis not present

## 2017-10-21 DIAGNOSIS — I119 Hypertensive heart disease without heart failure: Secondary | ICD-10-CM | POA: Diagnosis not present

## 2017-10-21 DIAGNOSIS — M25562 Pain in left knee: Secondary | ICD-10-CM | POA: Diagnosis not present

## 2017-10-21 DIAGNOSIS — K219 Gastro-esophageal reflux disease without esophagitis: Secondary | ICD-10-CM | POA: Diagnosis not present

## 2017-10-21 DIAGNOSIS — E785 Hyperlipidemia, unspecified: Secondary | ICD-10-CM | POA: Diagnosis not present

## 2017-10-21 DIAGNOSIS — I10 Essential (primary) hypertension: Secondary | ICD-10-CM | POA: Diagnosis not present

## 2017-10-21 DIAGNOSIS — D500 Iron deficiency anemia secondary to blood loss (chronic): Secondary | ICD-10-CM | POA: Diagnosis not present

## 2017-11-14 DIAGNOSIS — E119 Type 2 diabetes mellitus without complications: Secondary | ICD-10-CM | POA: Diagnosis not present

## 2017-11-14 DIAGNOSIS — J45909 Unspecified asthma, uncomplicated: Secondary | ICD-10-CM | POA: Diagnosis not present

## 2017-11-14 DIAGNOSIS — E1140 Type 2 diabetes mellitus with diabetic neuropathy, unspecified: Secondary | ICD-10-CM | POA: Diagnosis not present

## 2017-11-14 DIAGNOSIS — D500 Iron deficiency anemia secondary to blood loss (chronic): Secondary | ICD-10-CM | POA: Diagnosis not present

## 2017-11-14 DIAGNOSIS — R05 Cough: Secondary | ICD-10-CM | POA: Diagnosis not present

## 2017-11-14 DIAGNOSIS — I10 Essential (primary) hypertension: Secondary | ICD-10-CM | POA: Diagnosis not present

## 2017-11-14 DIAGNOSIS — K219 Gastro-esophageal reflux disease without esophagitis: Secondary | ICD-10-CM | POA: Diagnosis not present

## 2017-11-14 DIAGNOSIS — E785 Hyperlipidemia, unspecified: Secondary | ICD-10-CM | POA: Diagnosis not present

## 2017-11-14 DIAGNOSIS — I119 Hypertensive heart disease without heart failure: Secondary | ICD-10-CM | POA: Diagnosis not present

## 2017-11-25 DIAGNOSIS — I119 Hypertensive heart disease without heart failure: Secondary | ICD-10-CM | POA: Diagnosis not present

## 2017-11-25 DIAGNOSIS — E785 Hyperlipidemia, unspecified: Secondary | ICD-10-CM | POA: Diagnosis not present

## 2017-11-25 DIAGNOSIS — K219 Gastro-esophageal reflux disease without esophagitis: Secondary | ICD-10-CM | POA: Diagnosis not present

## 2017-11-25 DIAGNOSIS — D500 Iron deficiency anemia secondary to blood loss (chronic): Secondary | ICD-10-CM | POA: Diagnosis not present

## 2017-11-25 DIAGNOSIS — E1140 Type 2 diabetes mellitus with diabetic neuropathy, unspecified: Secondary | ICD-10-CM | POA: Diagnosis not present

## 2017-11-25 DIAGNOSIS — J45909 Unspecified asthma, uncomplicated: Secondary | ICD-10-CM | POA: Diagnosis not present

## 2017-11-25 DIAGNOSIS — I10 Essential (primary) hypertension: Secondary | ICD-10-CM | POA: Diagnosis not present

## 2017-11-25 DIAGNOSIS — E119 Type 2 diabetes mellitus without complications: Secondary | ICD-10-CM | POA: Diagnosis not present

## 2017-11-27 DIAGNOSIS — Z1231 Encounter for screening mammogram for malignant neoplasm of breast: Secondary | ICD-10-CM | POA: Diagnosis not present

## 2018-01-01 DIAGNOSIS — N393 Stress incontinence (female) (male): Secondary | ICD-10-CM | POA: Diagnosis not present

## 2018-01-03 ENCOUNTER — Other Ambulatory Visit: Payer: Self-pay | Admitting: Urology

## 2018-01-22 ENCOUNTER — Encounter (HOSPITAL_BASED_OUTPATIENT_CLINIC_OR_DEPARTMENT_OTHER): Payer: Self-pay | Admitting: *Deleted

## 2018-01-22 ENCOUNTER — Other Ambulatory Visit: Payer: Self-pay

## 2018-01-22 NOTE — Progress Notes (Addendum)
Spoke w/ pt via phone for pre-op interview.  Npo after mn.  Arrive at 0730.  Needs istat and ekg.  Will take nexium, metoprolol, and do dulera inhaler am dos w/ sips of water.  pt verbalized understanding to do half dose lantus insulin night before surgery.  Will bring rescue inhaler.

## 2018-01-27 ENCOUNTER — Encounter (HOSPITAL_BASED_OUTPATIENT_CLINIC_OR_DEPARTMENT_OTHER)
Admission: RE | Disposition: A | Discharge status: Home / Self Care | Payer: Self-pay | Source: Ambulatory Visit | Attending: Urology

## 2018-01-27 ENCOUNTER — Other Ambulatory Visit: Payer: Self-pay

## 2018-01-27 ENCOUNTER — Ambulatory Visit (HOSPITAL_BASED_OUTPATIENT_CLINIC_OR_DEPARTMENT_OTHER)
Admission: RE | Admit: 2018-01-27 | Discharge: 2018-01-27 | Disposition: A | Discharge status: Home / Self Care | Payer: Medicare HMO | Source: Ambulatory Visit | Attending: Urology | Admitting: Urology

## 2018-01-27 ENCOUNTER — Encounter (HOSPITAL_BASED_OUTPATIENT_CLINIC_OR_DEPARTMENT_OTHER): Payer: Self-pay | Admitting: Certified Registered Nurse Anesthetist

## 2018-01-27 ENCOUNTER — Ambulatory Visit (HOSPITAL_BASED_OUTPATIENT_CLINIC_OR_DEPARTMENT_OTHER): Payer: Medicare HMO | Admitting: Anesthesiology

## 2018-01-27 DIAGNOSIS — Z79899 Other long term (current) drug therapy: Secondary | ICD-10-CM | POA: Diagnosis not present

## 2018-01-27 DIAGNOSIS — Z87891 Personal history of nicotine dependence: Secondary | ICD-10-CM | POA: Insufficient documentation

## 2018-01-27 DIAGNOSIS — M19011 Primary osteoarthritis, right shoulder: Secondary | ICD-10-CM | POA: Diagnosis not present

## 2018-01-27 DIAGNOSIS — Z9842 Cataract extraction status, left eye: Secondary | ICD-10-CM | POA: Insufficient documentation

## 2018-01-27 DIAGNOSIS — N393 Stress incontinence (female) (male): Secondary | ICD-10-CM | POA: Insufficient documentation

## 2018-01-27 DIAGNOSIS — M19012 Primary osteoarthritis, left shoulder: Secondary | ICD-10-CM | POA: Insufficient documentation

## 2018-01-27 DIAGNOSIS — Z91013 Allergy to seafood: Secondary | ICD-10-CM | POA: Insufficient documentation

## 2018-01-27 DIAGNOSIS — Z961 Presence of intraocular lens: Secondary | ICD-10-CM | POA: Insufficient documentation

## 2018-01-27 DIAGNOSIS — Z885 Allergy status to narcotic agent status: Secondary | ICD-10-CM | POA: Insufficient documentation

## 2018-01-27 DIAGNOSIS — K219 Gastro-esophageal reflux disease without esophagitis: Secondary | ICD-10-CM | POA: Diagnosis not present

## 2018-01-27 DIAGNOSIS — I10 Essential (primary) hypertension: Secondary | ICD-10-CM | POA: Diagnosis not present

## 2018-01-27 DIAGNOSIS — Z794 Long term (current) use of insulin: Secondary | ICD-10-CM | POA: Diagnosis not present

## 2018-01-27 DIAGNOSIS — Z7951 Long term (current) use of inhaled steroids: Secondary | ICD-10-CM | POA: Insufficient documentation

## 2018-01-27 DIAGNOSIS — G4733 Obstructive sleep apnea (adult) (pediatric): Secondary | ICD-10-CM | POA: Diagnosis not present

## 2018-01-27 DIAGNOSIS — E119 Type 2 diabetes mellitus without complications: Secondary | ICD-10-CM | POA: Insufficient documentation

## 2018-01-27 DIAGNOSIS — Z9841 Cataract extraction status, right eye: Secondary | ICD-10-CM | POA: Diagnosis not present

## 2018-01-27 DIAGNOSIS — J4530 Mild persistent asthma, uncomplicated: Secondary | ICD-10-CM | POA: Insufficient documentation

## 2018-01-27 DIAGNOSIS — R918 Other nonspecific abnormal finding of lung field: Secondary | ICD-10-CM | POA: Diagnosis not present

## 2018-01-27 HISTORY — DX: Palpitations: R00.2

## 2018-01-27 HISTORY — DX: Long term (current) use of insulin: E11.9

## 2018-01-27 HISTORY — DX: Unspecified osteoarthritis, unspecified site: M19.90

## 2018-01-27 HISTORY — DX: Long term (current) use of insulin: Z79.4

## 2018-01-27 HISTORY — PX: PUBOVAGINAL SLING: SHX1035

## 2018-01-27 HISTORY — DX: Mild persistent asthma, uncomplicated: J45.30

## 2018-01-27 HISTORY — DX: Stress incontinence (female) (male): N39.3

## 2018-01-27 HISTORY — PX: CYSTOSCOPY: SHX5120

## 2018-01-27 LAB — POCT I-STAT 4, (NA,K, GLUC, HGB,HCT)
GLUCOSE: 185 mg/dL — AB (ref 65–99)
HEMATOCRIT: 40 % (ref 36.0–46.0)
Hemoglobin: 13.6 g/dL (ref 12.0–15.0)
POTASSIUM: 4 mmol/L (ref 3.5–5.1)
Sodium: 139 mmol/L (ref 135–145)

## 2018-01-27 LAB — POCT PREGNANCY, URINE: PREG TEST UR: NEGATIVE

## 2018-01-27 LAB — GLUCOSE, CAPILLARY: Glucose-Capillary: 182 mg/dL — ABNORMAL HIGH (ref 65–99)

## 2018-01-27 SURGERY — CREATION, PUBOVAGINAL SLING
Anesthesia: General | Site: Vagina

## 2018-01-27 MED ORDER — LIDOCAINE 2% (20 MG/ML) 5 ML SYRINGE
INTRAMUSCULAR | Status: AC
  Filled 2018-01-27: qty 5

## 2018-01-27 MED ORDER — FENTANYL CITRATE (PF) 100 MCG/2ML IJ SOLN
INTRAMUSCULAR | Status: DC | PRN
  Administered 2018-01-27 (×2): 50 ug via INTRAVENOUS

## 2018-01-27 MED ORDER — ACETAMINOPHEN 10 MG/ML IV SOLN
INTRAVENOUS | Status: AC
Start: 2018-01-27 — End: ?
  Filled 2018-01-27: qty 100

## 2018-01-27 MED ORDER — FENTANYL CITRATE (PF) 100 MCG/2ML IJ SOLN
INTRAMUSCULAR | Status: AC
  Filled 2018-01-27: qty 2

## 2018-01-27 MED ORDER — ONDANSETRON HCL 4 MG/2ML IJ SOLN
INTRAMUSCULAR | Status: AC
  Filled 2018-01-27: qty 2

## 2018-01-27 MED ORDER — PHENYLEPHRINE 40 MCG/ML (10ML) SYRINGE FOR IV PUSH (FOR BLOOD PRESSURE SUPPORT)
PREFILLED_SYRINGE | INTRAVENOUS | Status: AC
  Filled 2018-01-27: qty 10

## 2018-01-27 MED ORDER — PROMETHAZINE HCL 25 MG/ML IJ SOLN
6.2500 mg | INTRAMUSCULAR | Status: DC | PRN
  Filled 2018-01-27: qty 1

## 2018-01-27 MED ORDER — MIDAZOLAM HCL 2 MG/2ML IJ SOLN
INTRAMUSCULAR | Status: AC
  Filled 2018-01-27: qty 2

## 2018-01-27 MED ORDER — PROPOFOL 10 MG/ML IV BOLUS
INTRAVENOUS | Status: AC
  Filled 2018-01-27: qty 20

## 2018-01-27 MED ORDER — DEXAMETHASONE SODIUM PHOSPHATE 10 MG/ML IJ SOLN
INTRAMUSCULAR | Status: AC
  Filled 2018-01-27: qty 1

## 2018-01-27 MED ORDER — DEXAMETHASONE SODIUM PHOSPHATE 10 MG/ML IJ SOLN
INTRAMUSCULAR | Status: DC | PRN
  Administered 2018-01-27: 10 mg via INTRAVENOUS

## 2018-01-27 MED ORDER — LACTATED RINGERS IV SOLN
INTRAVENOUS | Status: DC
  Administered 2018-01-27: 09:00:00 via INTRAVENOUS
  Filled 2018-01-27: qty 1000

## 2018-01-27 MED ORDER — MIDAZOLAM HCL 2 MG/2ML IJ SOLN
INTRAMUSCULAR | Status: DC | PRN
  Administered 2018-01-27: 2 mg via INTRAVENOUS

## 2018-01-27 MED ORDER — OXYCODONE HCL 5 MG/5ML PO SOLN
5.0000 mg | Freq: Once | ORAL | Status: DC | PRN
  Filled 2018-01-27: qty 5

## 2018-01-27 MED ORDER — OXYCODONE HCL 5 MG PO TABS
5.0000 mg | ORAL_TABLET | Freq: Once | ORAL | Status: DC | PRN
  Filled 2018-01-27: qty 1

## 2018-01-27 MED ORDER — CEFAZOLIN (ANCEF) 1 G IV SOLR
2.0000 g | INTRAVENOUS | Status: AC
  Administered 2018-01-27: 2 g
  Filled 2018-01-27: qty 2

## 2018-01-27 MED ORDER — TRAMADOL HCL 50 MG PO TABS: 50.0000 mg | ORAL_TABLET | Freq: Four times a day (QID) | ORAL | 0 refills | Status: DC | PRN

## 2018-01-27 MED ORDER — ONDANSETRON HCL 4 MG/2ML IJ SOLN
INTRAMUSCULAR | Status: DC | PRN
  Administered 2018-01-27: 4 mg via INTRAVENOUS

## 2018-01-27 MED ORDER — MIDAZOLAM HCL 2 MG/2ML IJ SOLN
INTRAMUSCULAR | Status: AC
Start: 2018-01-27 — End: ?
  Filled 2018-01-27: qty 2

## 2018-01-27 MED ORDER — PROPOFOL 10 MG/ML IV BOLUS
INTRAVENOUS | Status: DC | PRN
  Administered 2018-01-27: 200 mg via INTRAVENOUS

## 2018-01-27 MED ORDER — ACETAMINOPHEN 10 MG/ML IV SOLN
INTRAVENOUS | Status: DC | PRN
  Administered 2018-01-27: 1000 mg via INTRAVENOUS

## 2018-01-27 MED ORDER — KETOROLAC TROMETHAMINE 30 MG/ML IJ SOLN
30.0000 mg | Freq: Once | INTRAMUSCULAR | Status: DC | PRN
  Filled 2018-01-27: qty 1

## 2018-01-27 MED ORDER — FENTANYL CITRATE (PF) 100 MCG/2ML IJ SOLN
25.0000 ug | INTRAMUSCULAR | Status: DC | PRN
  Filled 2018-01-27: qty 1

## 2018-01-27 MED ORDER — ESTRADIOL 0.1 MG/GM VA CREA
TOPICAL_CREAM | VAGINAL | Status: DC | PRN
  Administered 2018-01-27: 1 via VAGINAL

## 2018-01-27 MED ORDER — CEFAZOLIN SODIUM-DEXTROSE 2-4 GM/100ML-% IV SOLN
INTRAVENOUS | Status: AC
  Filled 2018-01-27: qty 100

## 2018-01-27 MED ORDER — LIDOCAINE 2% (20 MG/ML) 5 ML SYRINGE
INTRAMUSCULAR | Status: DC | PRN
  Administered 2018-01-27: 60 mg via INTRAVENOUS

## 2018-01-27 MED ORDER — LIDOCAINE-EPINEPHRINE (PF) 1 %-1:200000 IJ SOLN
INTRAMUSCULAR | Status: DC | PRN
  Administered 2018-01-27: 10 mL

## 2018-01-27 SURGICAL SUPPLY — 36 items
BAG DRAIN URO-CYSTO SKYTR STRL (DRAIN) ×4 IMPLANT
BAG URINE LEG 19OZ MD ST LTX (BAG) IMPLANT
BLADE CLIPPER SURG (BLADE) IMPLANT
BLADE SURG 15 STRL LF DISP TIS (BLADE) ×2 IMPLANT
BLADE SURG 15 STRL SS (BLADE) ×2
CANISTER SUCTION 1200CC (MISCELLANEOUS) ×4 IMPLANT
CATH FOLEY 2WAY SLVR  5CC 16FR (CATHETERS) ×2
CATH FOLEY 2WAY SLVR 5CC 16FR (CATHETERS) ×2 IMPLANT
CLOTH BEACON ORANGE TIMEOUT ST (SAFETY) ×4 IMPLANT
COVER MAYO STAND STRL (DRAPES) ×4 IMPLANT
DERMABOND ADVANCED (GAUZE/BANDAGES/DRESSINGS) ×2
DERMABOND ADVANCED .7 DNX12 (GAUZE/BANDAGES/DRESSINGS) ×2 IMPLANT
ELECT REM PT RETURN 9FT ADLT (ELECTROSURGICAL) ×4
ELECTRODE REM PT RTRN 9FT ADLT (ELECTROSURGICAL) ×2 IMPLANT
GLOVE BIO SURGEON STRL SZ8 (GLOVE) ×4 IMPLANT
GOWN STRL REUS W/ TWL XL LVL3 (GOWN DISPOSABLE) ×2 IMPLANT
GOWN STRL REUS W/TWL XL LVL3 (GOWN DISPOSABLE) ×2
KIT TURNOVER CYSTO (KITS) ×4 IMPLANT
MANIFOLD NEPTUNE II (INSTRUMENTS) IMPLANT
NEEDLE HYPO 22GX1.5 SAFETY (NEEDLE) IMPLANT
NEEDLE HYPO 25X1 1.5 SAFETY (NEEDLE) ×4 IMPLANT
NS IRRIG 500ML POUR BTL (IV SOLUTION) ×4 IMPLANT
PACK BASIN DAY SURGERY FS (CUSTOM PROCEDURE TRAY) ×4 IMPLANT
PACK CYSTO (CUSTOM PROCEDURE TRAY) ×4 IMPLANT
PACKING VAGINAL (PACKING) ×8 IMPLANT
PENCIL BUTTON HOLSTER BLD 10FT (ELECTRODE) ×4 IMPLANT
PLUG CATH AND CAP STER (CATHETERS) ×4 IMPLANT
SLING LYNX SUPRAPUBIC (Sling) ×4 IMPLANT
SUT VIC AB 2-0 UR5 27 (SUTURE) ×8 IMPLANT
SYR 10ML LL (SYRINGE) ×4 IMPLANT
SYR BULB IRRIGATION 50ML (SYRINGE) ×4 IMPLANT
TUBE CONNECTING 12'X1/4 (SUCTIONS) ×1
TUBE CONNECTING 12X1/4 (SUCTIONS) ×3 IMPLANT
WATER STERILE IRR 3000ML UROMA (IV SOLUTION) ×4 IMPLANT
WATER STERILE IRR 500ML POUR (IV SOLUTION) ×4 IMPLANT
YANKAUER SUCT BULB TIP NO VENT (SUCTIONS) ×4 IMPLANT

## 2018-01-27 NOTE — Anesthesia Procedure Notes (Signed)
Procedure Name: LMA Insertion Date/Time: 01/27/2018 10:04 AM Performed by: Wanita Chamberlain, CRNA Pre-anesthesia Checklist: Patient identified, Emergency Drugs available, Suction available, Patient being monitored and Timeout performed Patient Re-evaluated:Patient Re-evaluated prior to induction Oxygen Delivery Method: Circle system utilized Preoxygenation: Pre-oxygenation with 100% oxygen Induction Type: IV induction Ventilation: Mask ventilation without difficulty LMA: LMA inserted LMA Size: 4.0 Number of attempts: 1 Placement Confirmation: breath sounds checked- equal and bilateral,  CO2 detector and positive ETCO2 Tube secured with: Tape Dental Injury: Teeth and Oropharynx as per pre-operative assessment

## 2018-01-27 NOTE — Anesthesia Postprocedure Evaluation (Signed)
Anesthesia Post Note  Patient: Janeann Herst  Procedure(s) Performed: PUBO-VAGINAL SLING, LYNX SLING (N/A Vagina ) CYSTOSCOPY (N/A Bladder)     Patient location during evaluation: PACU Anesthesia Type: General Level of consciousness: awake and alert Pain management: pain level controlled Vital Signs Assessment: post-procedure vital signs reviewed and stable Respiratory status: spontaneous breathing, nonlabored ventilation, respiratory function stable and patient connected to nasal cannula oxygen Cardiovascular status: blood pressure returned to baseline and stable Postop Assessment: no apparent nausea or vomiting Anesthetic complications: no    Last Vitals:  Vitals:   01/27/18 1140 01/27/18 1142  BP:  (!) 146/87  Pulse: 80 84  Resp: 16 18  Temp:    SpO2: 93% 93%    Last Pain:  Vitals:   01/27/18 1140  TempSrc:   PainSc: 4                  Princess Karnes S

## 2018-01-27 NOTE — Interval H&P Note (Signed)
History and Physical Interval Note:  01/27/2018 9:52 AM  Leslie Scott  has presented today for surgery, with the diagnosis of STRESS URINARY INCONTINENCE  The various methods of treatment have been discussed with the patient and family. After consideration of risks, benefits and other options for treatment, the patient has consented to  Procedure(s): PUBO-VAGINAL SLING, LYNX SLING (N/A) CYSTOSCOPY (N/A) as a surgical intervention .  The patient's history has been reviewed, patient examined, no change in status, stable for surgery.  I have reviewed the patient's chart and labs.  Questions were answered to the patient's satisfaction.     Lillette Boxer [name]

## 2018-01-27 NOTE — Transfer of Care (Signed)
Immediate Anesthesia Transfer of Care Note  Patient: Leslie Scott  Procedure(s) Performed: PUBO-VAGINAL SLING, LYNX SLING (N/A Vagina ) CYSTOSCOPY (N/A Bladder)  Patient Location: PACU  Anesthesia Type:General  Level of Consciousness: awake, alert , oriented and patient cooperative  Airway & Oxygen Therapy: Patient Spontanous Breathing and Patient connected to nasal cannula oxygen  Post-op Assessment: Report given to RN and Post -op Vital signs reviewed and stable  Post vital signs: Reviewed and stable  Last Vitals:  Vitals Value Taken Time  BP 104/92 01/27/2018 10:58 AM  Temp    Pulse 46 01/27/2018 11:00 AM  Resp    SpO2 98 % 01/27/2018 11:00 AM  Vitals shown include unvalidated device data.  Last Pain:  Vitals:   01/27/18 0837  TempSrc:   PainSc: 0-No pain      Patients Stated Pain Goal: 8 (84/12/82 0813)  Complications: No apparent anesthesia complications

## 2018-01-27 NOTE — Anesthesia Preprocedure Evaluation (Addendum)
Anesthesia Evaluation  Patient identified by MRN, date of birth, ID band Patient awake    Reviewed: Allergy & Precautions, NPO status , Patient's Chart, lab work & pertinent test results  Airway Mallampati: II  TM Distance: >3 FB Neck ROM: Full    Dental no notable dental hx. (+) Teeth Intact, Dental Advisory Given   Pulmonary asthma , sleep apnea , former smoker,    Pulmonary exam normal breath sounds clear to auscultation       Cardiovascular hypertension, Pt. on home beta blockers and Pt. on medications Normal cardiovascular exam Rhythm:Regular Rate:Normal     Neuro/Psych negative neurological ROS  negative psych ROS   GI/Hepatic Neg liver ROS, GERD  Medicated and Controlled,  Endo/Other  diabetes, Type 2, Insulin Dependent, Oral Hypoglycemic Agents  Renal/GU negative Renal ROS  negative genitourinary   Musculoskeletal negative musculoskeletal ROS (+) Arthritis , Osteoarthritis,    Abdominal   Peds negative pediatric ROS (+)  Hematology negative hematology ROS (+)   Anesthesia Other Findings   Reproductive/Obstetrics negative OB ROS                           Anesthesia Physical Anesthesia Plan  ASA: III  Anesthesia Plan: General   Post-op Pain Management:    Induction: Intravenous  PONV Risk Score and Plan: 3 and Ondansetron, Midazolam and Treatment may vary due to age or medical condition  Airway Management Planned: LMA  Additional Equipment:   Intra-op Plan:   Post-operative Plan: Extubation in OR  Informed Consent: I have reviewed the patients History and Physical, chart, labs and discussed the procedure including the risks, benefits and alternatives for the proposed anesthesia with the patient or authorized representative who has indicated his/her understanding and acceptance.   Dental advisory given  Plan Discussed with: CRNA and Surgeon  Anesthesia Plan Comments:          Anesthesia Quick Evaluation

## 2018-01-27 NOTE — Discharge Instructions (Signed)
HOME CARE INSTRUCTIONS FOR VAGINAL SLING  Activity:  -No lifting greater than 10-15 pounds for 1 week, or as instructed by your                        physician.             -No sexual intercourse until your f/u visit Diet:  You may return to your normal diet tomorrow.   It is important to keep your       bowels regular during the postoperative period.  To avoid constipation, drink plenty of fluids during the day (8-10 glasses) and eat plenty of fresh fruits and vegetables.  Use a mild laxative or stool softener if necessary.  Wound Care:  You may begin showering tomorrow, or as instructed by your physician.      Return to Work as instructed by your physician.    Special Instructions:   Call your physician if any of these symptoms occur:   -temperature greater than 101 degrees Farenheit.   -redness, swelling or drainage at incision site.   -foul odor of your urine.   -a significant decrease in the amount of urine you have every day.   -severe pain not relieved by your pain medication.  Remove vaginal pack on Tuesday morning    Return to see your doctor .   Call to set up a follow-up appointment.   Do not take any Tylenol until after 4:30 pm today.     Post Anesthesia Home Care Instructions  Activity: Get plenty of rest for the remainder of the day. A responsible individual must stay with you for 24 hours following the procedure.  For the next 24 hours, DO NOT: -Drive a car -Paediatric nurse -Drink alcoholic beverages -Take any medication unless instructed by your physician -Make any legal decisions or sign important papers.  Meals: Start with liquid foods such as gelatin or soup. Progress to regular foods as tolerated. Avoid greasy, spicy, heavy foods. If nausea and/or vomiting occur, drink only clear liquids until the nausea and/or vomiting subsides. Call your physician if vomiting continues.  Special Instructions/Symptoms: Your throat may feel dry or sore from the  anesthesia or the breathing tube placed in your throat during surgery. If this causes discomfort, gargle with warm salt water. The discomfort should disappear within 24 hours.  If you had a scopolamine patch placed behind your ear for the management of post- operative nausea and/or vomiting:  1. The medication in the patch is effective for 72 hours, after which it should be removed.  Wrap patch in a tissue and discard in the trash. Wash hands thoroughly with soap and water. 2. You may remove the patch earlier than 72 hours if you experience unpleasant side effects which may include dry mouth, dizziness or visual disturbances. 3. Avoid touching the patch. Wash your hands with soap and water after contact with the patch.

## 2018-01-27 NOTE — H&P (Signed)
H&P  Chief Complaint: Urinary leakage  History of Present Illness: Leslie Scott is a 52 y.o. year old female who resents for [place] midurethral sling for mgmt of persistent SUI despite prior PT for this. She desires to proceed with surgical treatment.  Past Medical History:  Diagnosis Date  . Arthritis    shoulders  . GERD (gastroesophageal reflux disease)   . Hypertension   . Mild persistent asthma    pulmologist-  dr wert  . OSA (obstructive sleep apnea)    per pt no cpap  . Palpitations   . SUI (stress urinary incontinence, female)   . Type 2 diabetes mellitus treated with insulin ([place])    followed by pcp    Past Surgical History:  Procedure Laterality Date  . ABDOMINOPLASTY  2017   w/ Lipo  . BREAST ENHANCEMENT SURGERY Bilateral 2001   and Abdominoplasty  . CATARACT EXTRACTION W/ INTRAOCULAR LENS  IMPLANT, BILATERAL  2013    Home Medications:  No medications prior to admission.    Allergies:  Allergies  Allergen Reactions  . Morphine And Related Shortness Of Breath  . Shellfish Allergy Anaphylaxis    ALL SEAFOOD    Family History  Problem Relation Age of Onset  . [place] cancer Maternal Aunt   . Allergies Mother   . Asthma Mother     Social History:  reports that she quit smoking about 12 years ago. Her smoking use included cigarettes. She has a 10.00 pack-year smoking history. She has never used smokeless tobacco. She reports that she drinks alcohol. She reports that she does not use drugs.  ROS: A complete review of systems was performed.  All systems are negative except for pertinent findings as noted.  Physical Exam:  Vital signs in last 24 hours:   General:  Alert and oriented, No acute distress HEENT: Normocephalic, atraumatic Neck: No JVD or lymphadenopathy Cardiovascular: Regular rate and rhythm Lungs: Clear bilaterally Abdomen: Soft, nontender, nondistended, no abdominal masses Back: No CVA tenderness Extremities: No edema Neurologic: Grossly  intact  Laboratory Data:  No results found for this or any previous visit (from the past 24 hour(s)). No results found for this or any previous visit (from the past 240 hour(s)). Creatinine: No results for input(s): CREATININE in the last 168 hours.  Radiologic Imaging: No results found.  Impression/Assessment:  Female SUI  Plan:  [place] midurethral sling. She has been instructed in the procedure as well as expected risks/complications including but not limited to bleeding, need for transfusion, infection, retention (temporary/long-term w/ possible need for reoperation), erosion/extrusion of mesh material. She understands and desires to proceed.  Lillette Boxer Daveon Arpino 01/27/2018, 6:47 AM  Lillette Boxer. Natally Ribera MD

## 2018-01-27 NOTE — Op Note (Signed)
Preoperative diagnosis: Stress urinary incontinence  Postoperative diagnosis: Same  Procedure: Mid urethral sling placement using the [place] mesh system  Anesthesia: Gen.  Complications: None  Drains: None  EBL: < 50 ml  Indications:Leslie Scott is a 51 year old female with symptomatic stress urinary incontinence. Evaluation has revealed no underlying bladder abnormality, and no significant prolapse. Because of her significant, symptomatic leakage, she requests surgical treatment. Other methods have been discussed with her, including Kegel exercises, pelvic floor/physical therapy. She desires to proceed with surgical management with a mid urethral sling at this point. Risks and complications have been discussed with her. These include, but are not limited to infection, bleeding, extrusion/erosion of the sling, continued leakage, retention, among others. She understands these and desires to proceed.  Procedure: The patient was identified in the holding area and received preoperative IV antibiotics. She was taken to the operating room where general anesthesia was administered with the LMA. She was placed in the dorsolithotomy. Lower abdomen, genitalia and perineum were prepped. Timeout was then performed. A posterior weighted vaginal speculum was in place. Inspection of the vagina was then performed. No significant abnormalities were noted. The urethra was catheterized with a 16 [place] Foley, with 10 cc of water placed in the balloon. An Allis clamp was then placed on the urethral meatus, with anterior traction performed. This stretched out the anterior vaginal wall. The sub cutaneous tissue was then infiltrated with approximately 10 cc of 1% lidocaine with epinephrine. This was placed both in the midline and laterally toward the pubocervical fascia. Following infiltration with the lidocaine, a 1.5 cm incision was then made, starting at a position just inferior to what would be the bladder neck area, and  carried down to approximately 1 cm proximal to the urethral meatus. Dissection was then carried bilaterally sharply, dissecting to the pubocervical fascia bilaterally. Care was taken to avoid injury to the underlying urethra. Blunt dissection was used to assist with this dissection. Following adequate dissection bilaterally, 2 punctures were then placed approximately 1 cm above the pubic symphysis, approximately 1.5 cm each lateral to the midline with the 15 blade. 10 cc of 1% lidocaine with epinephrine were then used to infiltrate these areas. At this point, with a finger on the right side of the vaginal dissection, the suprapubic needle was then passed through the right puncture site, directly behind the right pubic bone through the space of Retzius, and then popped through the pubocervical fascia. The same procedure was done through the left suprapubic puncture site, to the left of the vaginal dissection. Once both needles were passed through, cystoscopic inspection was carried out in the bladder. The bladder neck was open. There were no urothelial lesions of the bladder. Both ureteral orifices were normal in configuration and location. There were no trabeculations. The impression of the needles could be seen bilaterally with significant downward pressure on the needles from above, but in no punctures were seen. Careful inspection was made with both the 12 and 70 lenses. Following cystoscopic exam, approximately 150 cc of water was left in the bladder to assist with a postoperative voiding trial. At this point, the ends of the mesh sling were passed onto the needles, and the sling was pulled anteriorly, leaving approximately 1 cm of "slack" underneath the urethra. Following adequate positioning of the mesh sling underneath the urethra, the vaginal fornices were inspected. There was no "buttonholing" of the vaginal fornices with the needles. At this point, the sling sheath was trimmed/removed. Careful inspection  of the sling  underneath the urethra revealed adequate positioning and an adequate gap around the urethra. The ends of the sling at the skin were carefully trimmed and pushed deep into the subcutaneous tissues. At this point, there was no significant bleeding through the vaginal incision or through the suprapubic punctures. The vaginal incision was closed after irrigation with a running 0 Vicryl placed in a simple fashion. The catheter was removed. Downward pressure on the bladder revealed some leakage of water with the Cred maneuver. The puncture sites were clean, and closed with Dermabond. At this point, the vagina was irrigated, and an estrace impregnated vaginal pack was placed and the procedure terminated. The patient was awakened and taken to the PACU in stable condition.S[pone, needle and instrument counts were correct x 2.

## 2018-01-28 ENCOUNTER — Encounter (HOSPITAL_BASED_OUTPATIENT_CLINIC_OR_DEPARTMENT_OTHER): Payer: Self-pay | Admitting: Urology

## 2018-01-29 DIAGNOSIS — E113412 Type 2 diabetes mellitus with severe nonproliferative diabetic retinopathy with macular edema, left eye: Secondary | ICD-10-CM | POA: Diagnosis not present

## 2018-01-29 DIAGNOSIS — H35032 Hypertensive retinopathy, left eye: Secondary | ICD-10-CM | POA: Diagnosis not present

## 2018-01-29 DIAGNOSIS — H3562 Retinal hemorrhage, left eye: Secondary | ICD-10-CM | POA: Diagnosis not present

## 2018-01-29 DIAGNOSIS — E113492 Type 2 diabetes mellitus with severe nonproliferative diabetic retinopathy without macular edema, left eye: Secondary | ICD-10-CM | POA: Diagnosis not present

## 2018-02-17 DIAGNOSIS — N393 Stress incontinence (female) (male): Secondary | ICD-10-CM | POA: Diagnosis not present

## 2018-03-16 ENCOUNTER — Other Ambulatory Visit: Payer: Self-pay

## 2018-03-16 ENCOUNTER — Emergency Department (HOSPITAL_COMMUNITY)
Admission: EM | Admit: 2018-03-16 | Discharge: 2018-03-16 | Disposition: A | Discharge status: Home / Self Care | Payer: Medicare HMO | Attending: Emergency Medicine | Admitting: Emergency Medicine

## 2018-03-16 ENCOUNTER — Encounter (HOSPITAL_COMMUNITY): Payer: Self-pay | Admitting: Emergency Medicine

## 2018-03-16 DIAGNOSIS — R0789 Other chest pain: Secondary | ICD-10-CM | POA: Diagnosis not present

## 2018-03-16 DIAGNOSIS — E119 Type 2 diabetes mellitus without complications: Secondary | ICD-10-CM | POA: Diagnosis not present

## 2018-03-16 DIAGNOSIS — I10 Essential (primary) hypertension: Secondary | ICD-10-CM | POA: Insufficient documentation

## 2018-03-16 DIAGNOSIS — Z87891 Personal history of nicotine dependence: Secondary | ICD-10-CM | POA: Insufficient documentation

## 2018-03-16 DIAGNOSIS — Z79899 Other long term (current) drug therapy: Secondary | ICD-10-CM | POA: Insufficient documentation

## 2018-03-16 DIAGNOSIS — T7840XA Allergy, unspecified, initial encounter: Secondary | ICD-10-CM | POA: Diagnosis not present

## 2018-03-16 DIAGNOSIS — J4530 Mild persistent asthma, uncomplicated: Secondary | ICD-10-CM | POA: Insufficient documentation

## 2018-03-16 DIAGNOSIS — Z794 Long term (current) use of insulin: Secondary | ICD-10-CM | POA: Insufficient documentation

## 2018-03-16 MED ORDER — IPRATROPIUM-ALBUTEROL 0.5-2.5 (3) MG/3ML IN SOLN
3.0000 mL | Freq: Once | RESPIRATORY_TRACT | Status: AC
  Administered 2018-03-16: 3 mL via RESPIRATORY_TRACT
  Filled 2018-03-16: qty 3

## 2018-03-16 MED ORDER — FAMOTIDINE IN NACL 20-0.9 MG/50ML-% IV SOLN
20.0000 mg | Freq: Once | INTRAVENOUS | Status: AC
  Administered 2018-03-16: 20 mg via INTRAVENOUS
  Filled 2018-03-16: qty 50

## 2018-03-16 MED ORDER — EPINEPHRINE 0.3 MG/0.3ML IJ SOAJ: 0.3000 mg | Freq: Once | INTRAMUSCULAR | 0 refills | Status: AC

## 2018-03-16 MED ORDER — SODIUM CHLORIDE 0.9 % IV BOLUS
1000.0000 mL | Freq: Once | INTRAVENOUS | Status: AC
  Administered 2018-03-16: 1000 mL via INTRAVENOUS

## 2018-03-16 MED ORDER — DIPHENHYDRAMINE HCL 25 MG PO TABS: 25.0000 mg | ORAL_TABLET | Freq: Four times a day (QID) | ORAL | 0 refills | Status: DC

## 2018-03-16 MED ORDER — PREDNISONE 20 MG PO TABS: 40.0000 mg | ORAL_TABLET | Freq: Every day | ORAL | 0 refills | Status: AC

## 2018-03-16 MED ORDER — METHYLPREDNISOLONE SODIUM SUCC 125 MG IJ SOLR
125.0000 mg | Freq: Once | INTRAMUSCULAR | Status: AC
  Administered 2018-03-16: 125 mg via INTRAVENOUS
  Filled 2018-03-16: qty 2

## 2018-03-16 MED ORDER — DIPHENHYDRAMINE HCL 25 MG PO CAPS
25.0000 mg | ORAL_CAPSULE | Freq: Once | ORAL | Status: AC
  Administered 2018-03-16: 25 mg via ORAL
  Filled 2018-03-16: qty 1

## 2018-03-16 MED ORDER — FAMOTIDINE 20 MG PO TABS: 20.0000 mg | ORAL_TABLET | Freq: Two times a day (BID) | ORAL | 0 refills | Status: DC

## 2018-03-16 NOTE — ED Notes (Signed)
Pt tolerating water well

## 2018-03-16 NOTE — Discharge Instructions (Signed)
Take Benadryl, Pepcid, prednisone as directed.  Follow-up with your primary care doctor next 3 to 4 days for further evaluation.  Return to emergency department for any difficulty breathing, swelling of her lips or tongue, difficulty swallowing, any other worsening or concerning symptoms.

## 2018-03-16 NOTE — ED Provider Notes (Signed)
[facility] EMERGENCY DEPARTMENT Provider Note   CSN: [id number] Arrival date & time: 03/16/18  1825     History   Chief Complaint Chief Complaint  Patient presents with  . Allergic Reaction    HPI Leslie Scott is a 52 y.o. female possible history of GERD, hypertension, asthma, palpitations who presents for evaluation of possible allergic reaction.  Patient reports that when she eats seafood, it causes chest tightness, difficulty breathing. She states that she has not had to use an epi pen before.  Patient states she did not use an EpiPen tonight.  Patient states that approximate 40 minutes prior to ED arrival, she ate an egg roll at a [employer].  Patient states she is eating and grows before but never at [employer].  Patient states that she started feeling "tingly and warm all over her body."  States that initially when it happened, she had some shortness of breath, chest tightness and "heat inside my head."  She states that since being in the ED, shortness of breath and chest tightness have resolved.  She states she still feels heat all over her body.  Patient states she is not having difficulty swallowing, tolerating secretions.  She does not feel like her lips or tongue or swelling.  Patient denies any nausea/vomiting.  Patient states she did not take any medication prior to ED arrival.  Patient does report a history of asthma.  She tried using her inhaler with minimal improvement.  She denies any fever, chest pain, difficulty breathing, tongue or lip swelling, difficulty swelling, vomiting.  The history is provided by the patient.    Past Medical History:  Diagnosis Date  . Arthritis    shoulders  . GERD (gastroesophageal reflux disease)   . Hypertension   . Mild persistent asthma    pulmologist-  dr wert  . OSA (obstructive sleep apnea)    per pt no cpap  . Palpitations   . SUI (stress urinary incontinence, female)   . Type 2 diabetes mellitus  treated with insulin ([place])    followed by pcp    Patient Active Problem List   Diagnosis Date Noted  . Multiple pulmonary nodules 08/15/2014  . Mild persistent asthma in adult without complication 80/99/8338    Past Surgical History:  Procedure Laterality Date  . ABDOMINOPLASTY  2017   w/ Lipo  . BREAST ENHANCEMENT SURGERY Bilateral 2001   and Abdominoplasty  . CATARACT EXTRACTION W/ INTRAOCULAR LENS  IMPLANT, BILATERAL  2013  . CYSTOSCOPY N/A 01/27/2018   Procedure: CYSTOSCOPY;  Surgeon: Franchot Gallo, MD;  Location: [facility];  Service: Urology;  Laterality: N/A;  . PUBOVAGINAL SLING N/A 01/27/2018   Procedure: Gaynelle Arabian, Janyth Pupa;  Surgeon: Franchot Gallo, MD;  [facility]: [facility];  Service: Urology;  Laterality: N/A;     OB History   None      Home Medications    Prior to Admission medications   Medication Sig Start Date End Date Taking? Authorizing Provider  albuterol (PROVENTIL) (5 MG/ML) 0.5% nebulizer solution Take 5 mg by nebulization every 6 (six) hours as needed for wheezing.    [provider]  diphenhydrAMINE (BENADRYL) 25 MG tablet Take 1 tablet (25 mg total) by mouth every 6 (six) hours. 03/16/18   Volanda Napoleon, PA-C  EPINEPHrine 0.3 mg/0.3 mL IJ SOAJ injection Inject 0.3 mLs (0.3 mg total) into the muscle once for 1 dose. 03/16/18 03/16/18  Volanda Napoleon, PA-C  esomeprazole (NEXIUM) 40 MG capsule Take 1 capsule (40 mg total) by mouth 2 (two) times daily. Patient taking differently: Take 40 mg by mouth 2 (two) times daily.  03/30/14   Tanna Furry, MD  famotidine (PEPCID) 20 MG tablet Take 1 tablet (20 mg total) by mouth 2 (two) times daily. 03/16/18   Volanda Napoleon, PA-C  insulin glargine (LANTUS) 100 UNIT/ML injection Inject 50 Units into the skin at bedtime.     [provider]  loratadine (CLARITIN) 10 MG tablet Take 10 mg by mouth daily as needed.     [provider]    losartan (COZAAR) 25 MG tablet Take 25 mg by mouth every morning.     [provider]  metoprolol succinate (TOPROL-XL) 50 MG 24 hr tablet Take 50 mg by mouth every morning.  09/02/14   [provider]  mometasone-formoterol (DULERA) 100-5 MCG/ACT AERO Take 2 puffs first thing in am and then another 2 puffs about 12 hours later. Patient taking differently: Inhale 1 puff into the lungs every morning. Take 2 puffs first thing in am and then another 2 puffs about 12 hours later. 02/24/16   Tanda Rockers, MD  predniSONE (DELTASONE) 20 MG tablet Take 2 tablets (40 mg total) by mouth daily for 4 days. 03/16/18 03/20/18  Volanda Napoleon, PA-C  sitaGLIPtin-metformin (JANUMET) 50-1000 MG per tablet Take 1 tablet by mouth 2 (two) times daily.  06/17/13   [provider]  traMADol (ULTRAM) 50 MG tablet Take 1 tablet (50 mg total) by mouth every 6 (six) hours as needed. 01/27/18   Franchot Gallo, MD  VENTOLIN HFA 108 (90 Base) MCG/ACT inhaler INHALE 2 PUFFS INTO THE LUNGS EVERY 6 (SIX) HOURS AS NEEDED FOR WHEEZING OR SHORTNESS OF BREATH. 07/16/16   Tanda Rockers, MD    Family History Family History  Problem Relation Age of Onset  . [place] cancer Maternal Aunt   . Allergies Mother   . Asthma Mother     Social History Social History   Tobacco Use  . Smoking status: Former Smoker    Packs/day: 0.50    Years: 20.00    Pack years: 10.00    Types: Cigarettes    Last attempt to quit: 08/06/2005    Years since quitting: 12.6  . Smokeless tobacco: Never Used  Substance Use Topics  . Alcohol use: Yes    Alcohol/week: 0.0 standard drinks    Comment: OCCASIONAL  . Drug use: No     Allergies   Morphine and related and Shellfish allergy   Review of Systems Review of Systems  Constitutional: Negative for fever.  HENT: Negative for drooling, facial swelling and trouble swallowing.   Respiratory: Positive for chest tightness (Resolved) and shortness of breath. Negative  for cough.   Gastrointestinal: Negative for abdominal pain, nausea and vomiting.  Neurological: Negative for headaches.  All other systems reviewed and are negative.    Physical Exam Updated Vital Signs BP 116/70   Pulse 99   Temp 98 F (36.7 C) (Oral)   Resp 16   Ht 5\' 1"  (1.549 m)   Wt 73.9 kg   SpO2 98%   BMI 30.80 kg/m   Physical Exam  Constitutional: She is oriented to person, place, and time. She appears well-developed and well-nourished.  HENT:  Head: Normocephalic and atraumatic.  Mouth/Throat: Uvula is midline, oropharynx is clear and moist and mucous membranes are normal. No trismus in the jaw. No posterior oropharyngeal edema or  posterior oropharyngeal erythema.  Airway patent. Phonation is intact. No oral angioedema. Posterior oropharynx is without any erythema, edema.    Eyes: Pupils are equal, round, and reactive to light. Conjunctivae, EOM and lids are normal.  Neck: Full passive range of motion without pain.  Cardiovascular: Regular rhythm, normal heart sounds and normal pulses. Tachycardia present. Exam reveals no gallop and no friction rub.  No murmur heard. Pulses:      Radial pulses are 2+ on the right side, and 2+ on the left side.  Pulmonary/Chest: Effort normal. She has wheezes.  Faint wheezing noted throughout all lung fields. No evidence of respiratory distress.  Speaking full sentences without any difficulty.  No evidence of increased work of breathing.  Abdominal: Soft. Normal appearance. There is no tenderness. There is no rigidity and no guarding.  Musculoskeletal: Normal range of motion.  Neurological: She is alert and oriented to person, place, and time.  Skin: Skin is warm and dry. Capillary refill takes less than 2 seconds.  Psychiatric: She has a normal mood and affect. Her speech is normal.  Nursing note and vitals reviewed.    ED Treatments / Results  Labs (all labs ordered are listed, but only abnormal results are displayed) Labs  Reviewed - No data to display  EKG EKG Interpretation  Date/Time:  Sunday March 16 2018 18:29:21 EDT Ventricular Rate:  97 PR Interval:  176 QRS Duration: 80 QT Interval:  362 QTC Calculation: 459 R Axis:   57 Text Interpretation:  Normal sinus rhythm Cannot rule out Anterior infarct , age undetermined Abnormal ECG No significant change since last tracing Confirmed by Yao, David H ([id number]) on 03/16/2018 8:25:04 PM   Radiology No results found.  Procedures Procedures (including critical care time)  Medications Ordered in ED Medications  diphenhydrAMINE (BENADRYL) capsule 25 mg (25 mg Oral Given 03/16/18 1835)  ipratropium-albuterol (DUONEB) 0.5-2.5 (3) MG/3ML nebulizer solution 3 mL (3 mLs Nebulization Given 03/16/18 1836)  sodium chloride 0.9 % bolus 1,000 mL (0 mLs Intravenous Stopped 03/16/18 2131)  methylPREDNISolone sodium succinate (SOLU-MEDROL) 125 mg/2 mL injection 125 mg (125 mg Intravenous Given 03/16/18 1843)  famotidine (PEPCID) IVPB 20 mg premix (0 mg Intravenous Stopped 03/16/18 2039)     Initial Impression / Assessment and Plan / ED Course  I have reviewed the triage vital signs and the nursing notes.  Pertinent labs & imaging results that were available during my care of the patient were reviewed by me and considered in my medical decision making (see chart for details).     52  year old female who presents for evaluation of possible allergic reaction.  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  She notes anaphylaxis reaction in her chart but patient states she has never had to use an EpiPen.  She did not use any EpiPen or medications prior to ED arrival.  Reports eating an egg roll and states that she is concerned it may have had some seafood in it.  Reports that she had some shortness of breath, chest tightness and "warm feeling all over her body" when the symptoms began.  Now on ED arrival just complaining of warm feeling.  No chest tightness, difficulty breathing,  lip or tongue swelling, difficulty swallowing. Patient is afebrile, non-toxic appearing, sitting comfortably on examination table. Vital signs reviewed and stable.  On exam, she does have some wheezing noted to the lung fields.  She does have a history of asthma.  No evidence of oral angioedema.  No evidence of anaphylaxis  reaction.  We will plan to give Benadryl, prednisone, Pepcid.  No occasion for EpiPen at this time.  Will give nebulizer treatment for wheezing.  Reevaluation after nebulizer treatment.  Wheezing has resolved.  Patient reports feeling better.  Vital signs are stable.  We will plan to p.o. challenge patient in the department.  Patient able to tolerate p.o. without any difficulty.  Reevaluation.  Patient with no symptoms now.  Patient states she does not have any tongue or lip swelling is not no difficulty tolerating p.o.  No evidence of respiratory distress.  Vital signs are stable.  She is slightly tachycardic.  I suspect this is from the albuterol treatment that she received.  We will plan to send patient home a short course of Benadryl, Pepcid, prednisone.  Patient also requesting an EpiPen.  Encourage primary care follow-up. Patient had ample opportunity for questions and discussion. All patient's questions were answered with full understanding. Strict return precautions discussed. Patient expresses understanding and agreement to plan.   Final Clinical Impressions(s) / ED Diagnoses   Final diagnoses:  Allergic reaction, initial encounter    ED Discharge Orders         Ordered    EPINEPHrine 0.3 mg/0.3 mL IJ SOAJ injection   Once     03/16/18 2108    diphenhydrAMINE (BENADRYL) 25 MG tablet  Every 6 hours     03/16/18 2108    famotidine (PEPCID) 20 MG tablet  2 times daily     03/16/18 2108    predniSONE (DELTASONE) 20 MG tablet  Daily     03/16/18 2108           Desma Mcgregor 03/16/18 2133    Drenda Freeze, MD 03/18/18 [phone]

## 2018-03-16 NOTE — ED Triage Notes (Signed)
Patient c/o of SOB, chest tightness and possible allergic reaction, and "heat inside my head". Stating she's allergic to sea food and she had an egg roll about 35min ago. 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 Patient shaking in triage. Used her inhaler, no epi pen. Denying difficulty swallowing in triage. provider at bedside.

## 2018-03-20 DIAGNOSIS — D500 Iron deficiency anemia secondary to blood loss (chronic): Secondary | ICD-10-CM | POA: Diagnosis not present

## 2018-03-20 DIAGNOSIS — E119 Type 2 diabetes mellitus without complications: Secondary | ICD-10-CM | POA: Diagnosis not present

## 2018-03-20 DIAGNOSIS — K219 Gastro-esophageal reflux disease without esophagitis: Secondary | ICD-10-CM | POA: Diagnosis not present

## 2018-03-20 DIAGNOSIS — E1140 Type 2 diabetes mellitus with diabetic neuropathy, unspecified: Secondary | ICD-10-CM | POA: Diagnosis not present

## 2018-03-20 DIAGNOSIS — J45909 Unspecified asthma, uncomplicated: Secondary | ICD-10-CM | POA: Diagnosis not present

## 2018-03-20 DIAGNOSIS — E785 Hyperlipidemia, unspecified: Secondary | ICD-10-CM | POA: Diagnosis not present

## 2018-03-20 DIAGNOSIS — I119 Hypertensive heart disease without heart failure: Secondary | ICD-10-CM | POA: Diagnosis not present

## 2018-03-20 DIAGNOSIS — I10 Essential (primary) hypertension: Secondary | ICD-10-CM | POA: Diagnosis not present

## 2018-04-30 DIAGNOSIS — N393 Stress incontinence (female) (male): Secondary | ICD-10-CM | POA: Diagnosis not present

## 2018-05-19 DIAGNOSIS — Z23 Encounter for immunization: Secondary | ICD-10-CM | POA: Diagnosis not present

## 2018-05-19 DIAGNOSIS — E119 Type 2 diabetes mellitus without complications: Secondary | ICD-10-CM | POA: Diagnosis not present

## 2018-05-19 DIAGNOSIS — E785 Hyperlipidemia, unspecified: Secondary | ICD-10-CM | POA: Diagnosis not present

## 2018-05-19 DIAGNOSIS — I10 Essential (primary) hypertension: Secondary | ICD-10-CM | POA: Diagnosis not present

## 2018-05-19 DIAGNOSIS — D500 Iron deficiency anemia secondary to blood loss (chronic): Secondary | ICD-10-CM | POA: Diagnosis not present

## 2018-05-19 DIAGNOSIS — R490 Dysphonia: Secondary | ICD-10-CM | POA: Diagnosis not present

## 2018-05-19 DIAGNOSIS — J45909 Unspecified asthma, uncomplicated: Secondary | ICD-10-CM | POA: Diagnosis not present

## 2018-05-19 DIAGNOSIS — E1140 Type 2 diabetes mellitus with diabetic neuropathy, unspecified: Secondary | ICD-10-CM | POA: Diagnosis not present

## 2018-05-19 DIAGNOSIS — K219 Gastro-esophageal reflux disease without esophagitis: Secondary | ICD-10-CM | POA: Diagnosis not present

## 2018-05-19 DIAGNOSIS — I119 Hypertensive heart disease without heart failure: Secondary | ICD-10-CM | POA: Diagnosis not present

## 2018-05-20 DIAGNOSIS — E119 Type 2 diabetes mellitus without complications: Secondary | ICD-10-CM | POA: Diagnosis not present

## 2018-07-01 DIAGNOSIS — K219 Gastro-esophageal reflux disease without esophagitis: Secondary | ICD-10-CM | POA: Diagnosis not present

## 2018-07-01 DIAGNOSIS — R490 Dysphonia: Secondary | ICD-10-CM | POA: Diagnosis not present

## 2018-08-13 DIAGNOSIS — R490 Dysphonia: Secondary | ICD-10-CM | POA: Diagnosis not present

## 2018-08-13 DIAGNOSIS — J3503 Chronic tonsillitis and adenoiditis: Secondary | ICD-10-CM | POA: Diagnosis not present

## 2018-08-13 DIAGNOSIS — K219 Gastro-esophageal reflux disease without esophagitis: Secondary | ICD-10-CM | POA: Diagnosis not present

## 2018-08-13 DIAGNOSIS — J353 Hypertrophy of tonsils with hypertrophy of adenoids: Secondary | ICD-10-CM | POA: Diagnosis not present

## 2018-08-19 DIAGNOSIS — E1140 Type 2 diabetes mellitus with diabetic neuropathy, unspecified: Secondary | ICD-10-CM | POA: Diagnosis not present

## 2018-08-19 DIAGNOSIS — K219 Gastro-esophageal reflux disease without esophagitis: Secondary | ICD-10-CM | POA: Diagnosis not present

## 2018-08-19 DIAGNOSIS — E782 Mixed hyperlipidemia: Secondary | ICD-10-CM | POA: Diagnosis not present

## 2018-08-19 DIAGNOSIS — I119 Hypertensive heart disease without heart failure: Secondary | ICD-10-CM | POA: Diagnosis not present

## 2018-08-19 DIAGNOSIS — E119 Type 2 diabetes mellitus without complications: Secondary | ICD-10-CM | POA: Diagnosis not present

## 2018-08-19 DIAGNOSIS — D500 Iron deficiency anemia secondary to blood loss (chronic): Secondary | ICD-10-CM | POA: Diagnosis not present

## 2018-08-19 DIAGNOSIS — I10 Essential (primary) hypertension: Secondary | ICD-10-CM | POA: Diagnosis not present

## 2018-08-19 DIAGNOSIS — J45909 Unspecified asthma, uncomplicated: Secondary | ICD-10-CM | POA: Diagnosis not present

## 2018-09-04 ENCOUNTER — Emergency Department (HOSPITAL_COMMUNITY): Payer: Medicare HMO

## 2018-09-04 ENCOUNTER — Emergency Department (HOSPITAL_COMMUNITY)
Admission: EM | Admit: 2018-09-04 | Discharge: 2018-09-04 | Disposition: A | Discharge status: Home / Self Care | Payer: Medicare HMO | Attending: Emergency Medicine | Admitting: Emergency Medicine

## 2018-09-04 ENCOUNTER — Encounter (HOSPITAL_COMMUNITY): Payer: Self-pay

## 2018-09-04 ENCOUNTER — Other Ambulatory Visit: Payer: Self-pay

## 2018-09-04 DIAGNOSIS — Z87891 Personal history of nicotine dependence: Secondary | ICD-10-CM | POA: Insufficient documentation

## 2018-09-04 DIAGNOSIS — Z79899 Other long term (current) drug therapy: Secondary | ICD-10-CM | POA: Insufficient documentation

## 2018-09-04 DIAGNOSIS — B9789 Other viral agents as the cause of diseases classified elsewhere: Secondary | ICD-10-CM | POA: Diagnosis not present

## 2018-09-04 DIAGNOSIS — R05 Cough: Secondary | ICD-10-CM | POA: Diagnosis not present

## 2018-09-04 DIAGNOSIS — E119 Type 2 diabetes mellitus without complications: Secondary | ICD-10-CM | POA: Insufficient documentation

## 2018-09-04 DIAGNOSIS — J069 Acute upper respiratory infection, unspecified: Secondary | ICD-10-CM | POA: Diagnosis not present

## 2018-09-04 DIAGNOSIS — Z794 Long term (current) use of insulin: Secondary | ICD-10-CM | POA: Diagnosis not present

## 2018-09-04 DIAGNOSIS — I10 Essential (primary) hypertension: Secondary | ICD-10-CM | POA: Diagnosis not present

## 2018-09-04 DIAGNOSIS — R0602 Shortness of breath: Secondary | ICD-10-CM | POA: Diagnosis not present

## 2018-09-04 MED ORDER — METHYLPREDNISOLONE SODIUM SUCC 40 MG IJ SOLR
40.0000 mg | Freq: Once | INTRAMUSCULAR | Status: AC
  Administered 2018-09-04: 40 mg via INTRAMUSCULAR
  Filled 2018-09-04: qty 1

## 2018-09-04 MED ORDER — BENZONATATE 100 MG PO CAPS: 100.0000 mg | ORAL_CAPSULE | Freq: Three times a day (TID) | ORAL | 0 refills | Status: DC

## 2018-09-04 MED ORDER — DM-GUAIFENESIN ER 30-600 MG PO TB12: 1.0000 | ORAL_TABLET | Freq: Two times a day (BID) | ORAL | 0 refills | Status: AC | PRN

## 2018-09-04 NOTE — ED Notes (Signed)
Patient in XRAY at this time. 

## 2018-09-04 NOTE — ED Triage Notes (Signed)
Pt arrives POV for eval of cough/congestion/cold symptoms x 1 week. Pt reports intermittent vomiting, states headache as well. Pt reports she has tried robitussin at home w/ no relief. Pt denies sick contacts. Endorses productive cough.

## 2018-09-04 NOTE — ED Provider Notes (Signed)
[facility] EMERGENCY DEPARTMENT Provider Note   CSN: [id number] Arrival date & time: 09/04/18  1823     History   Chief Complaint Chief Complaint  Patient presents with  . Flu Like Symptoms    HPI Leslie Scott is a 53 y.o. female with a PMH of Asthma, OSA, and HTN presenting with a productive cough, congestion, sore throat, and an intermittent mild frontal headache onset 1 week ago. Patient reports intermittent vomiting, but denies abdominal pain or diarrhea. Patient reports 1 episode of vomiting after coughing. Patient describes headache as an ache and states it is not different than previous headaches. Patient states she received her influenza vaccine. Patient states she has tried Robitussin without relief. Patient reports intermittent chest tightness with coughing. Patient reports asthma has been well controlled. Patient denies fever, shortness of breath, wheezing, chest pain, vision changes, weakness, numbness, syncope, dizziness, or difficulty walking. Patient reports sick contacts in the house.  HPI  Past Medical History:  Diagnosis Date  . Arthritis    shoulders  . GERD (gastroesophageal reflux disease)   . Hypertension   . Mild persistent asthma    pulmologist-  dr wert  . OSA (obstructive sleep apnea)    per pt no cpap  . Palpitations   . SUI (stress urinary incontinence, female)   . Type 2 diabetes mellitus treated with insulin ([place])    followed by pcp    Patient Active Problem List   Diagnosis Date Noted  . Multiple pulmonary nodules 08/15/2014  . Mild persistent asthma in adult without complication 25/95/6387    Past Surgical History:  Procedure Laterality Date  . ABDOMINOPLASTY  2017   w/ Lipo  . BREAST ENHANCEMENT SURGERY Bilateral 2001   and Abdominoplasty  . CATARACT EXTRACTION W/ INTRAOCULAR LENS  IMPLANT, BILATERAL  2013  . CYSTOSCOPY N/A 01/27/2018   Procedure: CYSTOSCOPY;  Surgeon: Franchot Gallo, MD;  Location[facility];  Service: Urology;  Laterality: N/A;  . PUBOVAGINAL SLING N/A 01/27/2018   Procedure: Gaynelle Arabian, Janyth Pupa;  Surgeon: Franchot Gallo, MD;  Location: [facility];  Service: Urology;  Laterality: N/A;     OB History   No obstetric history on file.      Home Medications    Prior to Admission medications   Medication Sig Start Date End Date Taking? Authorizing Provider  albuterol (PROVENTIL) (5 MG/ML) 0.5% nebulizer solution Take 5 mg by nebulization every 6 (six) hours as needed for wheezing.    [provider]  benzonatate (TESSALON) 100 MG capsule Take 1 capsule (100 mg total) by mouth every 8 (eight) hours. 09/04/18   Darlin Drop P, PA-C  dextromethorphan-guaiFENesin (MUCINEX DM) 30-600 MG 12hr tablet Take 1 tablet by mouth 2 (two) times daily as needed for up to 5 days for cough. 09/04/18 09/09/18  Darlin Drop P, PA-C  diphenhydrAMINE (BENADRYL) 25 MG tablet Take 1 tablet (25 mg total) by mouth every 6 (six) hours. 03/16/18   Volanda Napoleon, PA-C  esomeprazole (NEXIUM) 40 MG capsule Take 1 capsule (40 mg total) by mouth 2 (two) times daily. Patient taking differently: Take 40 mg by mouth 2 (two) times daily.  03/30/14   Tanna Furry, MD  famotidine (PEPCID) 20 MG tablet Take 1 tablet (20 mg total) by mouth 2 (two) times daily. 03/16/18   Volanda Napoleon, PA-C  insulin glargine (LANTUS) 100 UNIT/ML injection Inject 50 Units into the skin at bedtime.     [provider]  loratadine (CLARITIN) 10 MG tablet Take 10 mg by mouth daily as needed.     [provider]  losartan (COZAAR) 25 MG tablet Take 25 mg by mouth every morning.     [provider]  metoprolol succinate (TOPROL-XL) 50 MG 24 hr tablet Take 50 mg by mouth every morning.  09/02/14   [provider]  mometasone-formoterol (DULERA) 100-5 MCG/ACT AERO Take 2 puffs first thing in am and then another 2 puffs about 12 hours later. Patient taking  differently: Inhale 1 puff into the lungs every morning. Take 2 puffs first thing in am and then another 2 puffs about 12 hours later. 02/24/16   Tanda Rockers, MD  sitaGLIPtin-metformin (JANUMET) 50-1000 MG per tablet Take 1 tablet by mouth 2 (two) times daily.  06/17/13   [provider]  traMADol (ULTRAM) 50 MG tablet Take 1 tablet (50 mg total) by mouth every 6 (six) hours as needed. 01/27/18   Franchot Gallo, MD  VENTOLIN HFA 108 (90 Base) MCG/ACT inhaler INHALE 2 PUFFS INTO THE LUNGS EVERY 6 (SIX) HOURS AS NEEDED FOR WHEEZING OR SHORTNESS OF BREATH. 07/16/16   Tanda Rockers, MD    Family History Family History  Problem Relation Age of Onset  . Palladino cancer Maternal Aunt   . Allergies Mother   . Asthma Mother     Social History Social History   Tobacco Use  . Smoking status: Former Smoker    Packs/day: 0.50    Years: 20.00    Pack years: 10.00    Types: Cigarettes    Last attempt to quit: 08/06/2005    Years since quitting: 13.0  . Smokeless tobacco: Never Used  Substance Use Topics  . Alcohol use: Yes    Alcohol/week: 0.0 standard drinks    Comment: OCCASIONAL  . Drug use: No     Allergies   Morphine and related and Shellfish allergy   Review of Systems Review of Systems  Constitutional: Positive for fever. Negative for activity change, appetite change and fatigue.  HENT: Positive for congestion, rhinorrhea and sore throat. Negative for ear pain, postnasal drip, trouble swallowing and voice change.   Eyes: Negative for pain, redness and itching.  Respiratory: Positive for cough and chest tightness. Negative for shortness of breath and wheezing.   Cardiovascular: Negative for chest pain.  Gastrointestinal: Positive for vomiting. Negative for abdominal pain and diarrhea.  Genitourinary: Negative for dysuria.  Musculoskeletal: Positive for myalgias.  Skin: Negative for rash.  Allergic/Immunologic: Negative for environmental allergies.  Neurological:  Positive for headaches. Negative for dizziness, syncope, weakness and numbness.    Physical Exam Updated Vital Signs BP (!) 145/79 (BP Location: Right Arm)   Pulse 87   Temp 98.4 F (36.9 C) (Oral)   Resp 16   SpO2 97%   Physical Exam Vitals signs and nursing note reviewed.  Constitutional:      General: She is not in acute distress.    Appearance: She is well-developed. She is not diaphoretic.  HENT:     Head: Normocephalic and atraumatic.     Right Ear: Tympanic membrane, ear canal and external ear normal. No middle ear effusion.     Left Ear: Tympanic membrane, ear canal and external ear normal.  No middle ear effusion.     Nose: Mucosal edema, congestion and rhinorrhea present.     Mouth/Throat:     Mouth: Mucous membranes are moist.     Pharynx: Uvula midline. Posterior  oropharyngeal erythema present. No oropharyngeal exudate.  Eyes:     General:        Right eye: No discharge.        Left eye: No discharge.     Conjunctiva/sclera: Conjunctivae normal.  Neck:     Musculoskeletal: Normal range of motion and neck supple.  Cardiovascular:     Rate and Rhythm: Normal rate and regular rhythm.     Heart sounds: Normal heart sounds. No murmur. No friction rub. No gallop.   Pulmonary:     Effort: Pulmonary effort is normal. No respiratory distress.     Breath sounds: Normal breath sounds. No wheezing or rales.  Abdominal:     Palpations: Abdomen is soft.     Tenderness: There is no abdominal tenderness.  Musculoskeletal: Normal range of motion.  Lymphadenopathy:     Cervical: No cervical adenopathy.  Skin:    General: Skin is warm.     Findings: No rash.  Neurological:     Mental Status: She is alert.    Mental Status:  Alert, oriented, thought content appropriate, able to give a coherent history. Speech fluent without evidence of aphasia. Able to follow 2 step commands without difficulty.  Cranial Nerves:  II:  Peripheral visual fields grossly normal, pupils equal,  round, reactive to light III,IV, VI: ptosis not present, extra-ocular motions intact bilaterally  V,VII: smile symmetric, facial light touch sensation equal VIII: hearing grossly normal to voice  X: uvula elevates symmetrically  XI: bilateral shoulder shrug symmetric and strong XII: midline tongue extension without fassiculations Motor:  Normal tone. 5/5 in upper and lower extremities bilaterally including strong and equal grip strength and dorsiflexion/plantar flexion Sensory: Pinprick and light touch normal in all extremities.  Deep Tendon Reflexes: 2+ and symmetric in the biceps and patella Cerebellar: normal finger-to-nose with bilateral upper extremities Gait: normal gait and balance.  Negative pronator drift. Negative Romberg sign. CV: distal pulses palpable throughout    ED Treatments / Results  Labs (all labs ordered are listed, but only abnormal results are displayed) Labs Reviewed - No data to display  EKG None ED ECG REPORT   Date: 09/04/2018  Rate: 84 bpm  Rhythm: normal sinus rhythm  QRS Axis: normal  Intervals: normal  ST/T Wave abnormalities: normal  Conduction Disutrbances:none  Narrative Interpretation:   Old EKG Reviewed: unchanged  I have personally reviewed the EKG tracing and agree with the computerized printout as noted.  Radiology Dg Chest 2 View  Result Date: 09/04/2018 CLINICAL DATA:  Cough and shortness of breath for 8 days. EXAM: CHEST - 2 VIEW COMPARISON:  01/16/2016 chest CT FINDINGS: The cardiomediastinal silhouette is unremarkable. Mild peribronchial thickening noted. There is no evidence of focal airspace disease, pulmonary edema, suspicious pulmonary nodule/mass, pleural effusion, or pneumothorax. No acute bony abnormalities are identified. IMPRESSION: Mild peribronchial thickening without focal pneumonia. Electronically Signed   By: Margarette Canada M.D.   On: 09/04/2018 20:02    Procedures Procedures (including critical care  time)  Medications Ordered in ED Medications  methylPREDNISolone sodium succinate (SOLU-MEDROL) 40 mg/mL injection 40 mg (40 mg Intramuscular Given 09/04/18 2251)     Initial Impression / Assessment and Plan / ED Course  I have reviewed the triage vital signs and the nursing notes.  Pertinent labs & imaging results that were available during my care of the patient were reviewed by me and considered in my medical decision making (see chart for details).    Pt CXR negative for acute  infiltrate. Patients symptoms are consistent with URI, likely viral etiology. Discussed that antibiotics are not indicated for viral infections. Discussed influenza testing and tamiflu. Patient declined at this time. Discussed providing tylenol for headache, patient declined at this time. Provided solu-medrol injection per patient's request. Patient reports having steroid injections have helped her symptoms in the past. Pt will be discharged with symptomatic treatment. Advised patient to follow up with PCP in 2 days. Discussed return precautions with patient. Verbalizes understanding and is agreeable with plan. Pt is hemodynamically stable & in NAD prior to dc.  Final Clinical Impressions(s) / ED Diagnoses   Final diagnoses:  Viral URI with cough    ED Discharge Orders         Ordered    dextromethorphan-guaiFENesin [facility] DM) 30-600 MG 12hr tablet  2 times daily PRN     09/04/18 2306    benzonatate (TESSALON) 100 MG capsule  Every 8 hours     09/04/18 2306           Arville Lime, [place] 09/04/18 2315    Dorie Rank, MD 09/05/18 [id number]

## 2018-09-04 NOTE — Discharge Instructions (Addendum)
At this time you appear to have a viral upper respiratory infection.  Treatment of symptoms is the best plan.  This includes tylenol or motrin for aches and pains, gargling with salt water for sore throat, increased fluid intake, especially hot drinks to soothe the throat.  Over the counter medications that include decongestants and cough suppressants may also help.  Return to the ER for worsening condition or new concerning symptoms.  1. Medications: mucinex, tessalon for cough, usual home medications 2. Treatment: rest, drink plenty of fluids, take tylenol or ibuprofen for fever control 3. Follow Up: Please follow up with your primary doctor in 3 days for discussion of your diagnoses and further evaluation after today's visit; if you do not have a primary care doctor use the resource guide provided to find one; Return to the ER for high fevers, difficulty breathing or other concerning symptoms  Continue to stay well-hydrated. Gargle warm salt water and spit it out for sore throat. Take benadryl or other antihistamine to decrease secretions and for watery itchy eyes. Continued to alternate between Tylenol and ibuprofen for pain. Follow up with your primary care doctor in 5-7 days or referral for urgent care for recheck of ongoing symptoms however return to emergency department for emergent changing or worsening of symptoms.   Read the instructions below on reasons to return to the emergency department and to learn more about your diagnosis.  Use over the counter medications for symptomatic relief as we discussed (mucinex as a decongestant, Tylenol for fever/pain, Motrin/Ibuprofen for muscle aches). If prescribed a cough suppressant during your visit, do not operate heavy machinery with in 5 hours of taking this medication. Follow up with your primary care doctor in 4 days if your symptoms persist.  Your more than welcome to return to the emergency department if symptoms worsen or become concerning.  Upper  Respiratory Infection, Adult  An upper respiratory infection (URI) is also sometimes known as the common cold. Most people improve within 1 week, but symptoms can last up to 2 weeks. A residual cough may last even longer.   URI is most commonly caused by a virus. Viruses are NOT treated with antibiotics. You can easily spread the virus to others by oral contact. This includes kissing, sharing a glass, coughing, or sneezing. Touching your mouth or nose and then touching a surface, which is then touched by another person, can also spread the virus.   TREATMENT  Treatment is directed at relieving symptoms. There is no cure. Antibiotics are not effective, because the infection is caused by a virus, not by bacteria. Treatment may include:  Increased fluid intake. Sports drinks offer valuable electrolytes, sugars, and fluids.  Breathing heated mist or steam (vaporizer or shower).  Eating chicken soup or other clear broths, and maintaining good nutrition.  Getting plenty of rest.  Using gargles or lozenges for comfort.  Controlling fevers with ibuprofen or acetaminophen as directed by your caregiver.  Increasing usage of your inhaler if you have asthma.  Return to work when your temperature has returned to normal.   SEEK MEDICAL CARE IF:  After the first few days, you feel you are getting worse rather than better.  You develop worsening shortness of breath, or brown or red sputum. These may be signs of pneumonia.  You develop yellow or brown nasal discharge or pain in the face, especially when you bend forward. These may be signs of sinusitis.  You develop a fever, swollen neck glands, pain with swallowing,  or white areas in the back of your throat. These may be signs of strep throat.  Severe headache or stiff neck. Uncontrolled vomiting. Lightheadedness, feeling faint, or if you pass out. Problems breathing or shortness of breath. Weakness or inability to walk. Any other concerning symptoms or  concerns. If not improving over 7 days or if feeling worse at any time.  RESOURCE GUIDE  Dental Problems  Patients with Medicaid: [place] Phone:  [phone]                                                                             Phone:  [phone]  If unable to pay or uninsured, contact:  [facility] or [facility]. to become qualified for the adult dental clinic.  Chronic Pain Problems Contact [facility]  [phone] Patients need to be referred by their primary care doctor.  Insufficient Money for Medicine Contact [employer]:  call "211" or [place] [phone].  No Primary Care Doctor Call [facility]  [phone] Other agencies that provide inexpensive medical care    [place]  [phone]    [facility] Internal Medicine  [place]  [phone]    [facility]  [phone]    [facility]  [place]  [phone] [facility]  [phone] [place]   [phone] (emergency services [phone])  Abuse/Neglect [place] [phone] [place] [phone] (After Hours)  Emergency [place] [phone]  [place] at the [place] [phone] [place] [phone]  MRSA Hotline #:   (917)880-4809  [facility] Dept. [address]          [place]  Sela Hua Phone:  [phone]                                     Phone:  [phone]                   Phone:  [place] Phone:  [place] [phone] (After Hours)   If you develop symptoms of Shortness of Breath, Chest Pain, Swelling of lips, mouth or tongue or if your condition becomes worse with any new symptoms, see your doctor or return to the Emergency Department for immediate care. Emergency services are not intended to be a substitute for comprehensive medical attention.  Please contact your doctor for follow up if not improving as expected.   Call your doctor in 5-7 days or as directed if there is no improvement.   Community Resources: *IF YOU ARE IN IMMEDIATE DANGER CALL 911!  Abuse/Neglect:  Family Services Crisis Hotline [facility]): 219-048-5510 [facility] Against Violence [facility]): [phone]  After hours, holidays and weekends: 647-725-8902 National Domestic Violence Hotline: [place]: [place]: [place]: [phone]  Health Clinics:  Urgent [place] ([place]): [phone] Monday - Friday 8 AM - 9 PM, Saturday and Sunday 10 AM - 9 PM  [facility] [place]: [phone] Monday - Friday 8 AM - 5 PM  [facility]  [address]: [phone] Monday- Friday 8:30 AM - 5:30 PM, Sat 9 AM - 1 PM  24 HR [place] [facility] [address]: [phone] [facility] [address]: [phone] [facility] [address]: [phone]  24 [facility] [place] [facility]: [address] [phone]  Cultures: If culture results are positive, we will notify you if a change in treatment is necessary.  LABORATORY TESTS:         If you had any labs drawn in the ED that have not resulted by the time you are discharged home, we will  review these lab results and the treatment given to you.  If there is any further treatment or notification needed, we will contact you by phone, or letter.  "PLEASE ENSURE THAT YOU HAVE GIVEN US YOUR CURRENT WORKING PHONE NUMBER AND YOUR CURRENT ADDRESS, so that we can contact you if needed."  RADIOLOGY TESTS:  If the referred physician wants todays x-rays, please call the hospitals Radiology Department the day before your doctors appointment. [place]     [phone] [place]   [phone] [place]     [phone]  Our doctors and staff appreciate your choosing [place] for your emergency medical care needs. We are here to serve you.

## 2018-09-09 DIAGNOSIS — E1140 Type 2 diabetes mellitus with diabetic neuropathy, unspecified: Secondary | ICD-10-CM | POA: Diagnosis not present

## 2018-09-09 DIAGNOSIS — I10 Essential (primary) hypertension: Secondary | ICD-10-CM | POA: Diagnosis not present

## 2018-09-09 DIAGNOSIS — E785 Hyperlipidemia, unspecified: Secondary | ICD-10-CM | POA: Diagnosis not present

## 2018-09-09 DIAGNOSIS — E119 Type 2 diabetes mellitus without complications: Secondary | ICD-10-CM | POA: Diagnosis not present

## 2018-09-09 DIAGNOSIS — K219 Gastro-esophageal reflux disease without esophagitis: Secondary | ICD-10-CM | POA: Diagnosis not present

## 2018-09-09 DIAGNOSIS — D500 Iron deficiency anemia secondary to blood loss (chronic): Secondary | ICD-10-CM | POA: Diagnosis not present

## 2018-09-09 DIAGNOSIS — R05 Cough: Secondary | ICD-10-CM | POA: Diagnosis not present

## 2018-09-09 DIAGNOSIS — I119 Hypertensive heart disease without heart failure: Secondary | ICD-10-CM | POA: Diagnosis not present

## 2018-09-09 DIAGNOSIS — J45909 Unspecified asthma, uncomplicated: Secondary | ICD-10-CM | POA: Diagnosis not present

## 2018-10-03 DIAGNOSIS — D500 Iron deficiency anemia secondary to blood loss (chronic): Secondary | ICD-10-CM | POA: Diagnosis not present

## 2018-10-03 DIAGNOSIS — I119 Hypertensive heart disease without heart failure: Secondary | ICD-10-CM | POA: Diagnosis not present

## 2018-10-03 DIAGNOSIS — J45909 Unspecified asthma, uncomplicated: Secondary | ICD-10-CM | POA: Diagnosis not present

## 2018-10-03 DIAGNOSIS — E119 Type 2 diabetes mellitus without complications: Secondary | ICD-10-CM | POA: Diagnosis not present

## 2018-10-03 DIAGNOSIS — I10 Essential (primary) hypertension: Secondary | ICD-10-CM | POA: Diagnosis not present

## 2018-10-03 DIAGNOSIS — E1140 Type 2 diabetes mellitus with diabetic neuropathy, unspecified: Secondary | ICD-10-CM | POA: Diagnosis not present

## 2018-10-03 DIAGNOSIS — E785 Hyperlipidemia, unspecified: Secondary | ICD-10-CM | POA: Diagnosis not present

## 2018-10-03 DIAGNOSIS — Z0001 Encounter for general adult medical examination with abnormal findings: Secondary | ICD-10-CM | POA: Diagnosis not present

## 2018-10-03 DIAGNOSIS — K219 Gastro-esophageal reflux disease without esophagitis: Secondary | ICD-10-CM | POA: Diagnosis not present

## 2018-10-13 DIAGNOSIS — Z124 Encounter for screening for malignant neoplasm of cervix: Secondary | ICD-10-CM | POA: Diagnosis not present

## 2018-10-13 DIAGNOSIS — N951 Menopausal and female climacteric states: Secondary | ICD-10-CM | POA: Diagnosis not present

## 2018-10-13 DIAGNOSIS — Z6831 Body mass index (BMI) 31.0-31.9, adult: Secondary | ICD-10-CM | POA: Diagnosis not present

## 2018-10-13 DIAGNOSIS — Z01419 Encounter for gynecological examination (general) (routine) without abnormal findings: Secondary | ICD-10-CM | POA: Diagnosis not present

## 2018-10-15 DIAGNOSIS — R490 Dysphonia: Secondary | ICD-10-CM | POA: Diagnosis not present

## 2018-10-15 DIAGNOSIS — J3503 Chronic tonsillitis and adenoiditis: Secondary | ICD-10-CM | POA: Diagnosis not present

## 2018-10-15 DIAGNOSIS — K219 Gastro-esophageal reflux disease without esophagitis: Secondary | ICD-10-CM | POA: Diagnosis not present

## 2018-10-15 DIAGNOSIS — J353 Hypertrophy of tonsils with hypertrophy of adenoids: Secondary | ICD-10-CM | POA: Diagnosis not present

## 2018-10-31 ENCOUNTER — Other Ambulatory Visit: Payer: Self-pay | Admitting: Otolaryngology

## 2018-12-24 DIAGNOSIS — Z1231 Encounter for screening mammogram for malignant neoplasm of breast: Secondary | ICD-10-CM | POA: Diagnosis not present

## 2019-01-02 DIAGNOSIS — Z136 Encounter for screening for cardiovascular disorders: Secondary | ICD-10-CM | POA: Diagnosis not present

## 2019-01-02 DIAGNOSIS — I10 Essential (primary) hypertension: Secondary | ICD-10-CM | POA: Diagnosis not present

## 2019-01-02 DIAGNOSIS — D500 Iron deficiency anemia secondary to blood loss (chronic): Secondary | ICD-10-CM | POA: Diagnosis not present

## 2019-01-02 DIAGNOSIS — E1140 Type 2 diabetes mellitus with diabetic neuropathy, unspecified: Secondary | ICD-10-CM | POA: Diagnosis not present

## 2019-01-02 DIAGNOSIS — I119 Hypertensive heart disease without heart failure: Secondary | ICD-10-CM | POA: Diagnosis not present

## 2019-01-02 DIAGNOSIS — Z0000 Encounter for general adult medical examination without abnormal findings: Secondary | ICD-10-CM | POA: Diagnosis not present

## 2019-01-02 DIAGNOSIS — J45909 Unspecified asthma, uncomplicated: Secondary | ICD-10-CM | POA: Diagnosis not present

## 2019-01-02 DIAGNOSIS — E119 Type 2 diabetes mellitus without complications: Secondary | ICD-10-CM | POA: Diagnosis not present

## 2019-01-02 DIAGNOSIS — E785 Hyperlipidemia, unspecified: Secondary | ICD-10-CM | POA: Diagnosis not present

## 2019-02-23 ENCOUNTER — Other Ambulatory Visit: Payer: Self-pay

## 2019-02-23 ENCOUNTER — Encounter (HOSPITAL_BASED_OUTPATIENT_CLINIC_OR_DEPARTMENT_OTHER): Payer: Self-pay | Admitting: *Deleted

## 2019-02-25 ENCOUNTER — Other Ambulatory Visit (HOSPITAL_COMMUNITY): Admission: RE | Admit: 2019-02-25 | Payer: Medicare HMO | Source: Ambulatory Visit

## 2019-02-27 ENCOUNTER — Other Ambulatory Visit: Payer: Self-pay

## 2019-02-27 ENCOUNTER — Encounter (HOSPITAL_BASED_OUTPATIENT_CLINIC_OR_DEPARTMENT_OTHER)
Admission: RE | Admit: 2019-02-27 | Discharge: 2019-02-27 | Disposition: A | Discharge status: Home / Self Care | Payer: Medicare HMO | Source: Ambulatory Visit | Attending: Otolaryngology | Admitting: Otolaryngology

## 2019-02-27 ENCOUNTER — Other Ambulatory Visit (HOSPITAL_COMMUNITY)
Admission: RE | Admit: 2019-02-27 | Discharge: 2019-02-27 | Disposition: A | Discharge status: Home / Self Care | Payer: Medicare HMO | Source: Ambulatory Visit | Attending: Otolaryngology | Admitting: Otolaryngology

## 2019-02-27 DIAGNOSIS — Z1159 Encounter for screening for other viral diseases: Secondary | ICD-10-CM | POA: Diagnosis not present

## 2019-02-27 DIAGNOSIS — Z01812 Encounter for preprocedural laboratory examination: Secondary | ICD-10-CM | POA: Diagnosis not present

## 2019-02-27 LAB — BASIC METABOLIC PANEL
Anion gap: 9 (ref 5–15)
BUN: 17 mg/dL (ref 6–20)
CO2: 28 mmol/L (ref 22–32)
Calcium: 9.7 mg/dL (ref 8.9–10.3)
Chloride: 103 mmol/L (ref 98–111)
Creatinine, Ser: 0.75 mg/dL (ref 0.44–1.00)
GFR calc Af Amer: >60 mL/min (ref >60)
GFR calc non Af Amer: >60 mL/min (ref >60)
Glucose, Bld: 151 mg/dL — ABNORMAL HIGH (ref 70–99)
Potassium: 5 mmol/L (ref 3.5–5.1)
Sodium: 140 mmol/L (ref 135–145)

## 2019-02-28 LAB — SARS CORONAVIRUS 2 (TAT 6-24 HRS): SARS Coronavirus 2: NEGATIVE

## 2019-03-03 ENCOUNTER — Ambulatory Visit (HOSPITAL_BASED_OUTPATIENT_CLINIC_OR_DEPARTMENT_OTHER)
Admission: RE | Admit: 2019-03-03 | Discharge: 2019-03-03 | Disposition: A | Discharge status: Home / Self Care | Payer: Medicare HMO | Attending: Otolaryngology | Admitting: Otolaryngology

## 2019-03-03 ENCOUNTER — Other Ambulatory Visit: Payer: Self-pay

## 2019-03-03 ENCOUNTER — Ambulatory Visit (HOSPITAL_BASED_OUTPATIENT_CLINIC_OR_DEPARTMENT_OTHER): Payer: Medicare HMO | Admitting: Anesthesiology

## 2019-03-03 ENCOUNTER — Encounter (HOSPITAL_BASED_OUTPATIENT_CLINIC_OR_DEPARTMENT_OTHER): Payer: Self-pay | Admitting: Anesthesiology

## 2019-03-03 ENCOUNTER — Encounter (HOSPITAL_BASED_OUTPATIENT_CLINIC_OR_DEPARTMENT_OTHER)
Admission: RE | Disposition: A | Discharge status: Home / Self Care | Payer: Self-pay | Source: Home / Self Care | Attending: Otolaryngology

## 2019-03-03 DIAGNOSIS — J45909 Unspecified asthma, uncomplicated: Secondary | ICD-10-CM | POA: Diagnosis not present

## 2019-03-03 DIAGNOSIS — Z87891 Personal history of nicotine dependence: Secondary | ICD-10-CM | POA: Insufficient documentation

## 2019-03-03 DIAGNOSIS — Z794 Long term (current) use of insulin: Secondary | ICD-10-CM | POA: Diagnosis not present

## 2019-03-03 DIAGNOSIS — J3501 Chronic tonsillitis: Secondary | ICD-10-CM | POA: Diagnosis not present

## 2019-03-03 DIAGNOSIS — J4520 Mild intermittent asthma, uncomplicated: Secondary | ICD-10-CM | POA: Diagnosis not present

## 2019-03-03 DIAGNOSIS — K219 Gastro-esophageal reflux disease without esophagitis: Secondary | ICD-10-CM | POA: Insufficient documentation

## 2019-03-03 DIAGNOSIS — G4730 Sleep apnea, unspecified: Secondary | ICD-10-CM | POA: Diagnosis not present

## 2019-03-03 DIAGNOSIS — Z79899 Other long term (current) drug therapy: Secondary | ICD-10-CM | POA: Diagnosis not present

## 2019-03-03 DIAGNOSIS — I10 Essential (primary) hypertension: Secondary | ICD-10-CM | POA: Diagnosis not present

## 2019-03-03 DIAGNOSIS — J352 Hypertrophy of adenoids: Secondary | ICD-10-CM | POA: Diagnosis not present

## 2019-03-03 DIAGNOSIS — J312 Chronic pharyngitis: Secondary | ICD-10-CM | POA: Diagnosis not present

## 2019-03-03 DIAGNOSIS — R490 Dysphonia: Secondary | ICD-10-CM | POA: Diagnosis not present

## 2019-03-03 DIAGNOSIS — J353 Hypertrophy of tonsils with hypertrophy of adenoids: Secondary | ICD-10-CM | POA: Diagnosis not present

## 2019-03-03 DIAGNOSIS — E119 Type 2 diabetes mellitus without complications: Secondary | ICD-10-CM | POA: Diagnosis not present

## 2019-03-03 DIAGNOSIS — J3503 Chronic tonsillitis and adenoiditis: Secondary | ICD-10-CM | POA: Diagnosis not present

## 2019-03-03 HISTORY — DX: Hypertrophy of tonsils with hypertrophy of adenoids: J35.3

## 2019-03-03 HISTORY — PX: TONSILLECTOMY AND ADENOIDECTOMY: SHX28

## 2019-03-03 LAB — GLUCOSE, CAPILLARY: Glucose-Capillary: 89 mg/dL (ref 70–99)

## 2019-03-03 LAB — POCT PREGNANCY, URINE: Preg Test, Ur: NEGATIVE

## 2019-03-03 SURGERY — TONSILLECTOMY AND ADENOIDECTOMY
Anesthesia: General | Site: Mouth | Laterality: Bilateral

## 2019-03-03 MED ORDER — ACETAMINOPHEN 10 MG/ML IV SOLN: 1000.0000 mg | Freq: Once | INTRAVENOUS | Status: DC | PRN

## 2019-03-03 MED ORDER — PROMETHAZINE HCL 25 MG/ML IJ SOLN: 6.2500 mg | INTRAMUSCULAR | Status: DC | PRN

## 2019-03-03 MED ORDER — AMOXICILLIN 400 MG/5ML PO SUSR: 800.0000 mg | Freq: Two times a day (BID) | ORAL | 0 refills | Status: AC

## 2019-03-03 MED ORDER — SODIUM CHLORIDE 0.9 % IR SOLN
Status: DC | PRN
  Administered 2019-03-03: 500 mL

## 2019-03-03 MED ORDER — OXYMETAZOLINE HCL 0.05 % NA SOLN
NASAL | Status: DC | PRN
  Administered 2019-03-03: 1 via TOPICAL

## 2019-03-03 MED ORDER — HYDROMORPHONE HCL 1 MG/ML IJ SOLN
0.2500 mg | INTRAMUSCULAR | Status: DC | PRN
Start: 2019-03-03 — End: 2019-03-03

## 2019-03-03 MED ORDER — FENTANYL CITRATE (PF) 100 MCG/2ML IJ SOLN
INTRAMUSCULAR | Status: AC
  Filled 2019-03-03: qty 2

## 2019-03-03 MED ORDER — ONDANSETRON HCL 4 MG/2ML IJ SOLN
INTRAMUSCULAR | Status: AC
  Filled 2019-03-03: qty 2

## 2019-03-03 MED ORDER — LACTATED RINGERS IV SOLN
INTRAVENOUS | Status: DC
  Administered 2019-03-03: 08:00:00 via INTRAVENOUS

## 2019-03-03 MED ORDER — DEXAMETHASONE SODIUM PHOSPHATE 4 MG/ML IJ SOLN
INTRAMUSCULAR | Status: DC | PRN
  Administered 2019-03-03: 10 mg via INTRAVENOUS

## 2019-03-03 MED ORDER — OXYCODONE-ACETAMINOPHEN 5-325 MG PO TABS: 1.0000 | ORAL_TABLET | ORAL | 0 refills | Status: AC | PRN

## 2019-03-03 MED ORDER — DEXAMETHASONE SODIUM PHOSPHATE 10 MG/ML IJ SOLN
INTRAMUSCULAR | Status: AC
  Filled 2019-03-03: qty 1

## 2019-03-03 MED ORDER — SCOPOLAMINE 1 MG/3DAYS TD PT72: 1.0000 | MEDICATED_PATCH | Freq: Once | TRANSDERMAL | Status: DC

## 2019-03-03 MED ORDER — MIDAZOLAM HCL 2 MG/2ML IJ SOLN
INTRAMUSCULAR | Status: AC
  Filled 2019-03-03: qty 2

## 2019-03-03 MED ORDER — LIDOCAINE 2% (20 MG/ML) 5 ML SYRINGE
INTRAMUSCULAR | Status: AC
  Filled 2019-03-03: qty 5

## 2019-03-03 MED ORDER — ONDANSETRON HCL 4 MG/2ML IJ SOLN
INTRAMUSCULAR | Status: DC | PRN
  Administered 2019-03-03: 4 mg via INTRAVENOUS

## 2019-03-03 MED ORDER — FENTANYL CITRATE (PF) 100 MCG/2ML IJ SOLN
50.0000 ug | INTRAMUSCULAR | Status: AC | PRN
  Administered 2019-03-03 (×3): 50 ug via INTRAVENOUS

## 2019-03-03 MED ORDER — DEXAMETHASONE SODIUM PHOSPHATE 10 MG/ML IJ SOLN
INTRAMUSCULAR | Status: AC
  Filled 2019-03-03: qty 2

## 2019-03-03 MED ORDER — AMOXICILLIN 875 MG PO TABS: 875.0000 mg | ORAL_TABLET | Freq: Two times a day (BID) | ORAL | 0 refills | Status: DC

## 2019-03-03 MED ORDER — PROPOFOL 10 MG/ML IV BOLUS
INTRAVENOUS | Status: DC | PRN
  Administered 2019-03-03: 150 mg via INTRAVENOUS

## 2019-03-03 MED ORDER — LIDOCAINE 2% (20 MG/ML) 5 ML SYRINGE
INTRAMUSCULAR | Status: DC | PRN
  Administered 2019-03-03: 80 mg via INTRAVENOUS

## 2019-03-03 MED ORDER — OXYCODONE HCL 5 MG PO TABS: 5.0000 mg | ORAL_TABLET | Freq: Once | ORAL | Status: DC | PRN

## 2019-03-03 MED ORDER — SUCCINYLCHOLINE CHLORIDE 200 MG/10ML IV SOSY
PREFILLED_SYRINGE | INTRAVENOUS | Status: AC
  Filled 2019-03-03: qty 10

## 2019-03-03 MED ORDER — MIDAZOLAM HCL 2 MG/2ML IJ SOLN
1.0000 mg | INTRAMUSCULAR | Status: DC | PRN
  Administered 2019-03-03: 2 mg via INTRAVENOUS

## 2019-03-03 MED ORDER — ONDANSETRON HCL 4 MG/2ML IJ SOLN
INTRAMUSCULAR | Status: AC
  Filled 2019-03-03: qty 4

## 2019-03-03 MED ORDER — OXYCODONE HCL 5 MG/5ML PO SOLN: 5.0000 mg | Freq: Once | ORAL | Status: DC | PRN

## 2019-03-03 MED ORDER — SUCCINYLCHOLINE CHLORIDE 20 MG/ML IJ SOLN
INTRAMUSCULAR | Status: DC | PRN
  Administered 2019-03-03: 100 mg via INTRAVENOUS

## 2019-03-03 SURGICAL SUPPLY — 34 items
BNDG COHESIVE 2X5 TAN STRL LF (GAUZE/BANDAGES/DRESSINGS) IMPLANT
CANISTER SUCT 1200ML W/VALVE (MISCELLANEOUS) ×3 IMPLANT
CATH ROBINSON RED A/P 10FR (CATHETERS) IMPLANT
CATH ROBINSON RED A/P 14FR (CATHETERS) ×2 IMPLANT
COAGULATOR SUCT 6 FR SWTCH (ELECTROSURGICAL)
COAGULATOR SUCT SWTCH 10FR 6 (ELECTROSURGICAL) IMPLANT
COVER BACK TABLE REUSABLE LG (DRAPES) ×3 IMPLANT
COVER MAYO STAND REUSABLE (DRAPES) ×3 IMPLANT
COVER WAND RF STERILE (DRAPES) IMPLANT
DRAPE HALF SHEET 70X43 (DRAPES) ×3 IMPLANT
ELECT REM PT RETURN 9FT ADLT (ELECTROSURGICAL) ×3
ELECT REM PT RETURN 9FT PED (ELECTROSURGICAL)
ELECTRODE REM PT RETRN 9FT PED (ELECTROSURGICAL) IMPLANT
ELECTRODE REM PT RTRN 9FT ADLT (ELECTROSURGICAL) IMPLANT
GAUZE SPONGE 4X4 12PLY STRL LF (GAUZE/BANDAGES/DRESSINGS) ×3 IMPLANT
GLOVE BIO SURGEON STRL SZ7.5 (GLOVE) ×3 IMPLANT
GLOVE BIOGEL PI IND STRL 7.0 (GLOVE) IMPLANT
GLOVE BIOGEL PI INDICATOR 7.0 (GLOVE) ×2
GLOVE ECLIPSE 6.5 STRL STRAW (GLOVE) ×2 IMPLANT
GOWN STRL REUS W/ TWL LRG LVL3 (GOWN DISPOSABLE) ×2 IMPLANT
GOWN STRL REUS W/TWL LRG LVL3 (GOWN DISPOSABLE) ×4
IV NS 500ML (IV SOLUTION) ×2
IV NS 500ML BAXH (IV SOLUTION) ×1 IMPLANT
MARKER SKIN DUAL TIP RULER LAB (MISCELLANEOUS) IMPLANT
NS IRRIG 1000ML POUR BTL (IV SOLUTION) ×3 IMPLANT
SOLUTION BUTLER CLEAR DIP (MISCELLANEOUS) ×3 IMPLANT
SPONGE TONSIL TAPE 1.25 RFD (DISPOSABLE) ×3 IMPLANT
SYR BULB 3OZ (MISCELLANEOUS) IMPLANT
TOWEL GREEN STERILE FF (TOWEL DISPOSABLE) ×3 IMPLANT
TUBE CONNECTING 20'X1/4 (TUBING) ×1
TUBE CONNECTING 20X1/4 (TUBING) ×2 IMPLANT
TUBE SALEM SUMP 12R W/ARV (TUBING) IMPLANT
TUBE SALEM SUMP 16 FR W/ARV (TUBING) ×2 IMPLANT
WAND COBLATOR 70 EVAC XTRA (SURGICAL WAND) ×3 IMPLANT

## 2019-03-03 NOTE — Anesthesia Postprocedure Evaluation (Signed)
Anesthesia Post Note  Patient: Leslie Scott  Procedure(s) Performed: TONSILLECTOMY AND ADENOIDECTOMY (Bilateral Mouth)     Patient location during evaluation: PACU Anesthesia Type: General Level of consciousness: awake and alert Pain management: pain level controlled Vital Signs Assessment: post-procedure vital signs reviewed and stable Respiratory status: spontaneous breathing, nonlabored ventilation, respiratory function stable and patient connected to nasal cannula oxygen Cardiovascular status: blood pressure returned to baseline and stable Postop Assessment: no apparent nausea or vomiting Anesthetic complications: no    Last Vitals:  Vitals:   03/03/19 0937 03/03/19 0945  BP:  (!) 151/86  Pulse: 82 80  Resp: 18 12  Temp:    SpO2: 99% 96%    Last Pain:  Vitals:   03/03/19 0930  TempSrc:   PainSc: 4                  Malacki Mcphearson S

## 2019-03-03 NOTE — Op Note (Signed)
DATE OF PROCEDURE:  03/03/2019                              OPERATIVE REPORT  SURGEON:  Leta Baptist, MD  PREOPERATIVE DIAGNOSES: 1. Adenotonsillar hypertrophy. 2. Chronic tonsillitis and pharyngitis  POSTOPERATIVE DIAGNOSES: 1. Adenotonsillar hypertrophy. 2. Chronic tonsillitis and pharyngitis  PROCEDURE PERFORMED:  Adenotonsillectomy.  ANESTHESIA:  General endotracheal tube anesthesia.  COMPLICATIONS:  None.  ESTIMATED BLOOD LOSS:  Minimal.  INDICATION FOR PROCEDURE:  Leslie Scott is a 54 y.o. female with a history of chronic tonsillitis/pharyngitis.  According to the patient, she has been experiencing chronic throat discomfort with halitosis for several years. The patient continues to be symptomatic despite medical treatments. On examination, the patient was noted to have bilateral cryptic tonsils, with numerous tonsilloliths. Based on the above findings, the decision was made for the patient to undergo the adenotonsillectomy procedure. Likelihood of success in reducing symptoms was also discussed.  The risks, benefits, alternatives, and details of the procedure were discussed with the patient.  Questions were invited and answered.  Informed consent was obtained.  DESCRIPTION:  The patient was taken to the operating room and placed supine on the operating table.  General endotracheal tube anesthesia was administered by the anesthesiologist.  The patient was positioned and prepped and draped in a standard fashion for adenotonsillectomy.  A Crowe-Davis mouth gag was inserted into the oral cavity for exposure. 3+ cryptic tonsils were noted bilaterally.  No bifidity was noted.  Indirect mirror examination of the nasopharynx revealed mild adenoid hypertrophy. The adenoid was ablated with the Coblator device. Hemostasis was achieved with the Coblator device.  The right tonsil was then grasped with a straight Allis clamp and retracted medially.  It was resected free from the underlying pharyngeal  constrictor muscles with the Coblator device.  The same procedure was repeated on the left side without exception.  The surgical sites were copiously irrigated.  The mouth gag was removed.  The care of the patient was turned over to the anesthesiologist.  The patient was awakened from anesthesia without difficulty.  The patient was extubated and transferred to the recovery room in good condition.  OPERATIVE FINDINGS:  Adenotonsillar hypertrophy.  SPECIMEN: None  FOLLOWUP CARE:  The patient will be discharged home once awake and alert.  She will be placed on amoxicillin 800 mg p.o. b.i.d. for 5 days, and percocet for postop pain control.   The patient will follow up in my office in approximately 2 weeks.  Prayan Ulin W Adlene Adduci 03/03/2019 8:55 AM

## 2019-03-03 NOTE — H&P (Signed)
Cc: Chronic hoarseness, recurrent sore throat, enlarged tonsils.  HPI: The patient is a 53 year old female who returns today for her follow-up evaluation. The patient was last seen 2 months ago.  At that time, she was complaining of chronic hoarseness, recurrent sore throat and enlarged tonsils.  The patient was noted to have chronic tonsillitis, with 3+ cryptic tonsils bilaterally.  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  The patient was treated with Omeprazole and Famotidine. Diet modifications were also discussed.  The option of adenotonsillectomy was also reviewed with the patient.  According to the patient, she has had more recurrent tonsillitis.  She had 2 episodes of severe sore throat over the past month.  She was previously treated with multiple courses of antibiotics. She is still having difficulty with her hoarseness.  She denies any dysphagia, odynophagia, or dyspnea. No other ENT, GI, or respiratory issue noted since the last visit.   Exam: General: Communicates without difficulty, well nourished, no acute distress. Head: Normocephalic, no evidence injury, no tenderness, facial buttresses intact without stepoff. Eyes: PERRL, EOMI.  No scleral icterus, conjunctivae clear. Ears: External auditory canals clear bilaterally.  There is no edema or erythema.  Tympanic membrane is within normal limits bilaterally. Nose: Normal skin and external support.  Anterior rhinoscopy reveals healthy pink mucosa over the septum and turbinates.  No lesions or polyps were seen. Oral cavity: Lips without lesions, oral mucosa moist, no masses or lesions seen. 3+ cryptic tonsils bilaterally. Neck: Supple, full range of motion, no lymphadenopathy, no masses palpable. Salivary: Parotid and submandibular glands without mass. Neuro:  CN 2-12 grossly intact. Gait normal. Vestibular: No nystagmus at any point of gaze.   Procedure:  Flexible Fiberoptic  Laryngoscopy -- Risks, benefits, and alternatives of flexible endoscopy were explained to the patient.  Specific mention was made of the risk of throat numbness with difficulty swallowing, possible bleeding from the nose and mouth, and pain from the procedure.  The patient gave oral consent to proceed.  The nasal cavities were decongested and anesthetised with a combination of oxymetazoline and 4% lidocaine solution.  The flexible scope was inserted into the right nasal cavity and advanced towards the nasopharynx.  Visualized mucosa over the turbinates and septum were normal.  The nasopharynx was clear.  Oropharyngeal walls were symmetric and mobile without lesion, mass, or edema.  Hypopharynx was also without  lesion or edema.  Larynx was mobile without lesions.  No lesions or asymmetry in the supraglottic larynx.  Arytenoid mucosa was severely edematous with slight erythema.  True vocal folds were pale yellow and edematous but without mass or lesion.  Base of tongue was within normal limits. The patient tolerated the procedure well.   Assessment 1.  The patient's history and physical exam findings are consistent with chronic tonsillitis/pharyngitis, secondary to her adenotonsillar hypertrophy.  3+ cryptic tonsils are noted bilaterally.  2.  Chronic hoarseness, secondary to her severe laryngopharyngeal reflux.  She continues to have severe posterior laryngeal edema on today's laryngoscopy examination.  No other suspicious mass or lesion is noted today.   Plan 1.  The physical exam and laryngoscopy findings are reviewed with the patient.  2.  The patient should continue with her reflux treatment regimen. She is currently on Omeprazole and Famotidine.    3.  Continue diet modifications that could improve her laryngopharyngeal reflux.  4.  The option of adenotonsillectomy to treat her chronic tonsillitis is extensively discussed.  The  risks, alternatives, benefits and details of the procedure are reviewed  with the patient.  Questions are invited and answered.  5.  The patient would like to proceed with the adenotonsillectomy surgery.  We will schedule the procedure in accordance with the patient's schedule.

## 2019-03-03 NOTE — Transfer of Care (Signed)
Immediate Anesthesia Transfer of Care Note  Patient: Leslie Scott  Procedure(s) Performed: TONSILLECTOMY AND ADENOIDECTOMY (Bilateral Mouth)  Patient Location: PACU  Anesthesia Type:General  Level of Consciousness: drowsy  Airway & Oxygen Therapy: Patient Spontanous Breathing and Patient connected to face mask oxygen  Post-op Assessment: Report given to RN and Post -op Vital signs reviewed and stable  Post vital signs: Reviewed and stable  Last Vitals:  Vitals Value Taken Time  BP 163/82 03/03/19 0902  Temp    Pulse 80 03/03/19 0904  Resp 15 03/03/19 0904  SpO2 100 % 03/03/19 0904  Vitals shown include unvalidated device data.  Last Pain:  Vitals:   03/03/19 0755  TempSrc: Oral  PainSc: 0-No pain         Complications: No apparent anesthesia complications

## 2019-03-03 NOTE — Anesthesia Procedure Notes (Signed)
Procedure Name: Intubation Date/Time: 03/03/2019 8:30 AM Performed by: Eulas Post, Alizza Sacra W, CRNA Pre-anesthesia Checklist: Patient identified, Emergency Drugs available, Suction available and Patient being monitored Patient Re-evaluated:Patient Re-evaluated prior to induction Oxygen Delivery Method: Circle system utilized Preoxygenation: Pre-oxygenation with 100% oxygen Induction Type: IV induction Ventilation: Mask ventilation without difficulty Laryngoscope Size: Miller and 2 Grade View: Grade I Tube type: Oral Tube size: 7.0 mm Number of attempts: 1 Airway Equipment and Method: Stylet and Oral airway Placement Confirmation: ETT inserted through vocal cords under direct vision,  positive ETCO2 and breath sounds checked- equal and bilateral Secured at: 22 cm Tube secured with: Tape Dental Injury: Teeth and Oropharynx as per pre-operative assessment

## 2019-03-03 NOTE — Anesthesia Preprocedure Evaluation (Signed)
Anesthesia Evaluation  Patient identified by MRN, date of birth, ID band Patient awake    Reviewed: Allergy & Precautions, NPO status , Patient's Chart, lab work & pertinent test results  Airway Mallampati: II  TM Distance: >3 FB Neck ROM: Full    Dental no notable dental hx.    Pulmonary asthma , sleep apnea , former smoker,    Pulmonary exam normal breath sounds clear to auscultation       Cardiovascular hypertension, Normal cardiovascular exam Rhythm:Regular Rate:Normal     Neuro/Psych negative neurological ROS  negative psych ROS   GI/Hepatic negative GI ROS, Neg liver ROS,   Endo/Other  diabetes  Renal/GU negative Renal ROS  negative genitourinary   Musculoskeletal negative musculoskeletal ROS (+)   Abdominal   Peds negative pediatric ROS (+)  Hematology negative hematology ROS (+)   Anesthesia Other Findings   Reproductive/Obstetrics negative OB ROS                             Anesthesia Physical Anesthesia Plan  ASA: III  Anesthesia Plan: General   Post-op Pain Management:    Induction: Intravenous  PONV Risk Score and Plan: 3 and Ondansetron, Dexamethasone and Treatment may vary due to age or medical condition  Airway Management Planned: Oral ETT  Additional Equipment:   Intra-op Plan:   Post-operative Plan: Extubation in OR  Informed Consent: I have reviewed the patients History and Physical, chart, labs and discussed the procedure including the risks, benefits and alternatives for the proposed anesthesia with the patient or authorized representative who has indicated his/her understanding and acceptance.     Dental advisory given  Plan Discussed with: CRNA and Surgeon  Anesthesia Plan Comments:         Anesthesia Quick Evaluation

## 2019-03-03 NOTE — Discharge Instructions (Addendum)
[facility] Raynelle Bring M.D., P.A. Postoperative Instructions for Tonsillectomy & Adenoidectomy (T&A) Activity Restrict activity at home for the first two days, resting as much as possible. Light indoor activity is best. You may usually return to school or work within a week but void strenuous activity and sports for two weeks. Sleep with your head elevated on 2-3 pillows for 3-4 days to help decrease swelling. Diet Due to tissue swelling and throat discomfort, you may have little desire to drink for several days. However fluids are very important to prevent dehydration. You will find that non-acidic juices, soups, popsicles, Jell-O, custard, puddings, and any soft or mashed foods taken in small quantities can be swallowed fairly easily. Try to increase your fluid and food intake as the discomfort subsides. It is recommended that a child receive 1-1/2 quarts of fluid in a 24-hour period. Adult require twice this amount.  Discomfort Your sore throat may be relieved by applying an ice collar to your neck and/or by taking Tylenol. You may experience an earache, which is due to referred pain from the throat. Referred ear pain is commonly felt at night when trying to rest.     Post Anesthesia Home Care Instructions  Activity: Get plenty of rest for the remainder of the day. A responsible individual must stay with you for 24 hours following the procedure.  For the next 24 hours, DO NOT: -Drive a car -Paediatric nurse -Drink alcoholic beverages -Take any medication unless instructed by your physician -Make any legal decisions or sign important papers.  Meals: Start with liquid foods such as gelatin or soup. Progress to regular foods as tolerated. Avoid greasy, spicy, heavy foods. If nausea and/or vomiting occur, drink only clear liquids until the nausea and/or vomiting subsides. Call your physician if vomiting continues.  Special Instructions/Symptoms: Your throat may feel dry or sore from the anesthesia  or the breathing tube placed in your throat during surgery. If this causes discomfort, gargle with warm salt water. The discomfort should disappear within 24 hours.  If you had a scopolamine patch placed behind your ear for the management of post- operative nausea and/or vomiting:  1. The medication in the patch is effective for 72 hours, after which it should be removed.  Wrap patch in a tissue and discard in the trash. Wash hands thoroughly with soap and water. 2. You may remove the patch earlier than 72 hours if you experience unpleasant side effects which may include dry mouth, dizziness or visual disturbances. 3. Avoid touching the patch. Wash your hands with soap and water after contact with the patch.     Bleeding                        Although rare, there is risk of having some bleeding during the first 2 weeks after having a T&A. This usually happens between days 7-10 postoperatively. If you or your child should have any bleeding, try to remain calm. We recommend sitting up quietly in a chair and gently spitting out the blood into a bowl. For adults, gargling gently with ice water may help. If the bleeding does not stop after a short time (5 minutes), is more than 1 teaspoonful, or if you become worried, please call our office at [phone] or go directly to the nearest hospital emergency room. Do not eat or drink anything prior to going to the hospital as you may need to be taken to the operating room in order to  control the bleeding. GENERAL CONSIDERATIONS 1. Brush your teeth regularly. Avoid mouthwashes and gargles for three weeks. You may gargle gently with warm salt-water as necessary or spray with Chloraseptic. You may make salt-water by placing 2 teaspoons of table salt into a quart of fresh water. Warm the salt-water in a microwave to a luke warm temperature.  2. Avoid exposure to colds and upper respiratory infections if possible.  3. If you look into a mirror or into your  child's mouth, you will see white-gray patches in the back of the throat. This is normal after having a T&A and is like a scab that forms on the skin after an abrasion. It will disappear once the back of the throat heals completely. However, it may cause a noticeable odor; this too will disappear with time. Again, warm salt-water gargles may be used to help keep the throat clean and promote healing.  4. You may notice a temporary change in voice quality, such as a higher pitched voice or a nasal sound, until healing is complete. This may last for 1-2 weeks and should resolve.  5. Do not take or give you child any medications that we have not prescribed or recommended.  6. Snoring may occur, especially at night, for the first week after a T&A. It is due to swelling of the soft palate and will usually resolve.  Please call our office at [phone] if you have any questions.

## 2019-03-04 ENCOUNTER — Encounter (HOSPITAL_BASED_OUTPATIENT_CLINIC_OR_DEPARTMENT_OTHER): Payer: Self-pay | Admitting: Otolaryngology

## 2019-03-17 DIAGNOSIS — J45909 Unspecified asthma, uncomplicated: Secondary | ICD-10-CM | POA: Diagnosis not present

## 2019-03-17 DIAGNOSIS — E785 Hyperlipidemia, unspecified: Secondary | ICD-10-CM | POA: Diagnosis not present

## 2019-03-17 DIAGNOSIS — E119 Type 2 diabetes mellitus without complications: Secondary | ICD-10-CM | POA: Diagnosis not present

## 2019-03-17 DIAGNOSIS — M549 Dorsalgia, unspecified: Secondary | ICD-10-CM | POA: Diagnosis not present

## 2019-03-17 DIAGNOSIS — K219 Gastro-esophageal reflux disease without esophagitis: Secondary | ICD-10-CM | POA: Diagnosis not present

## 2019-03-17 DIAGNOSIS — I10 Essential (primary) hypertension: Secondary | ICD-10-CM | POA: Diagnosis not present

## 2019-03-17 DIAGNOSIS — I119 Hypertensive heart disease without heart failure: Secondary | ICD-10-CM | POA: Diagnosis not present

## 2019-03-17 DIAGNOSIS — D500 Iron deficiency anemia secondary to blood loss (chronic): Secondary | ICD-10-CM | POA: Diagnosis not present

## 2019-03-17 DIAGNOSIS — E1140 Type 2 diabetes mellitus with diabetic neuropathy, unspecified: Secondary | ICD-10-CM | POA: Diagnosis not present

## 2019-04-22 DIAGNOSIS — K219 Gastro-esophageal reflux disease without esophagitis: Secondary | ICD-10-CM | POA: Diagnosis not present

## 2019-04-22 DIAGNOSIS — E785 Hyperlipidemia, unspecified: Secondary | ICD-10-CM | POA: Diagnosis not present

## 2019-04-22 DIAGNOSIS — E1140 Type 2 diabetes mellitus with diabetic neuropathy, unspecified: Secondary | ICD-10-CM | POA: Diagnosis not present

## 2019-04-22 DIAGNOSIS — D500 Iron deficiency anemia secondary to blood loss (chronic): Secondary | ICD-10-CM | POA: Diagnosis not present

## 2019-04-22 DIAGNOSIS — I10 Essential (primary) hypertension: Secondary | ICD-10-CM | POA: Diagnosis not present

## 2019-04-22 DIAGNOSIS — E119 Type 2 diabetes mellitus without complications: Secondary | ICD-10-CM | POA: Diagnosis not present

## 2019-04-22 DIAGNOSIS — I119 Hypertensive heart disease without heart failure: Secondary | ICD-10-CM | POA: Diagnosis not present

## 2019-04-22 DIAGNOSIS — J45909 Unspecified asthma, uncomplicated: Secondary | ICD-10-CM | POA: Diagnosis not present

## 2019-04-22 DIAGNOSIS — Z23 Encounter for immunization: Secondary | ICD-10-CM | POA: Diagnosis not present

## 2019-05-26 DIAGNOSIS — E113412 Type 2 diabetes mellitus with severe nonproliferative diabetic retinopathy with macular edema, left eye: Secondary | ICD-10-CM | POA: Diagnosis not present

## 2019-05-26 DIAGNOSIS — E113492 Type 2 diabetes mellitus with severe nonproliferative diabetic retinopathy without macular edema, left eye: Secondary | ICD-10-CM | POA: Diagnosis not present

## 2019-05-26 DIAGNOSIS — H3562 Retinal hemorrhage, left eye: Secondary | ICD-10-CM | POA: Diagnosis not present

## 2019-05-26 DIAGNOSIS — H35032 Hypertensive retinopathy, left eye: Secondary | ICD-10-CM | POA: Diagnosis not present

## 2019-05-27 DIAGNOSIS — E113492 Type 2 diabetes mellitus with severe nonproliferative diabetic retinopathy without macular edema, left eye: Secondary | ICD-10-CM | POA: Diagnosis not present

## 2019-05-27 DIAGNOSIS — E113412 Type 2 diabetes mellitus with severe nonproliferative diabetic retinopathy with macular edema, left eye: Secondary | ICD-10-CM | POA: Diagnosis not present

## 2019-05-27 DIAGNOSIS — H16141 Punctate keratitis, right eye: Secondary | ICD-10-CM | POA: Diagnosis not present

## 2019-05-29 DIAGNOSIS — Z961 Presence of intraocular lens: Secondary | ICD-10-CM | POA: Diagnosis not present

## 2019-05-29 DIAGNOSIS — H0520 Unspecified exophthalmos: Secondary | ICD-10-CM | POA: Diagnosis not present

## 2019-05-29 DIAGNOSIS — H169 Unspecified keratitis: Secondary | ICD-10-CM | POA: Diagnosis not present

## 2019-05-29 DIAGNOSIS — H11043 Peripheral pterygium, stationary, bilateral: Secondary | ICD-10-CM | POA: Diagnosis not present

## 2019-05-31 ENCOUNTER — Encounter (HOSPITAL_COMMUNITY): Payer: Self-pay | Admitting: Emergency Medicine

## 2019-05-31 ENCOUNTER — Emergency Department (HOSPITAL_COMMUNITY)
Admission: EM | Admit: 2019-05-31 | Discharge: 2019-06-01 | Disposition: A | Discharge status: Home / Self Care | Payer: Medicare HMO | Attending: Emergency Medicine | Admitting: Emergency Medicine

## 2019-05-31 ENCOUNTER — Other Ambulatory Visit: Payer: Self-pay

## 2019-05-31 DIAGNOSIS — H5713 Ocular pain, bilateral: Secondary | ICD-10-CM

## 2019-05-31 DIAGNOSIS — Z87891 Personal history of nicotine dependence: Secondary | ICD-10-CM | POA: Diagnosis not present

## 2019-05-31 DIAGNOSIS — Z794 Long term (current) use of insulin: Secondary | ICD-10-CM | POA: Insufficient documentation

## 2019-05-31 DIAGNOSIS — I10 Essential (primary) hypertension: Secondary | ICD-10-CM | POA: Insufficient documentation

## 2019-05-31 DIAGNOSIS — Z79899 Other long term (current) drug therapy: Secondary | ICD-10-CM | POA: Diagnosis not present

## 2019-05-31 DIAGNOSIS — E119 Type 2 diabetes mellitus without complications: Secondary | ICD-10-CM | POA: Diagnosis not present

## 2019-05-31 NOTE — ED Triage Notes (Signed)
Pt states for 1 week she has had burning to her eyes. The eyes hurt to open and she has had blurry vision to both eyes for 1 week. Sclera are red. Reports some watering to the eyes.

## 2019-05-31 NOTE — ED Notes (Signed)
R eye 10/32  L eye 10/40

## 2019-05-31 NOTE — Discharge Instructions (Addendum)
Call Dr. Zenia Resides office in the morning to schedule an appointment for further management of recurrent eye pain.   Return to the emergency department with any new or concerning symptoms.

## 2019-05-31 NOTE — ED Provider Notes (Signed)
[facility] EMERGENCY DEPARTMENT Provider Note   CSN: [id number] Arrival date & time: 05/31/19  2022     History   Chief Complaint No chief complaint on file.   HPI Leslie Scott is a 53 y.o. female.     Patient to ED with bilateral eye burning type pain x 1-2 days with associated tearing, redness and blurred vision. Symptoms are recurrent and started initially one week ago. She has been seen by her retinal doctor (Rankin) who started her on steroid drops. He did refer her to Dr. Katy Fitch and when seen by him on Friday (2 days ago) her symptoms had vastly improved. Starting again yesterday, her eye symptoms were worse today prompting ED evaluation.   The history is provided by the patient. No language interpreter was used.    Past Medical History:  Diagnosis Date  . Adenotonsillar hypertrophy   . Arthritis    shoulders  . GERD (gastroesophageal reflux disease)   . Hypertension   . Mild persistent asthma    pulmologist-  dr wert  . OSA (obstructive sleep apnea)    per pt no cpap  . Palpitations   . SUI (stress urinary incontinence, female)   . Type 2 diabetes mellitus treated with insulin (Venturia)    followed by pcp    Patient Active Problem List   Diagnosis Date Noted  . Multiple pulmonary nodules 08/15/2014  . Mild persistent asthma in adult without complication [id number]    Past Surgical History:  Procedure Laterality Date  . ABDOMINOPLASTY  2017   w/ Lipo  . BREAST ENHANCEMENT SURGERY Bilateral 2001   and Abdominoplasty  . CATARACT EXTRACTION W/ INTRAOCULAR LENS  IMPLANT, BILATERAL  2013  . CYSTOSCOPY N/A 01/27/2018   Procedure: CYSTOSCOPY;  Surgeon: Franchot Gallo, MD;  Location: [facility];  Service: Urology;  Laterality: N/A;  . PUBOVAGINAL SLING N/A 01/27/2018   Procedure: Gaynelle Arabian, Janyth Pupa;  Surgeon: Franchot Gallo, MD;  Location: [facility];  Service: Urology;  Laterality: N/A;  .  TONSILLECTOMY AND ADENOIDECTOMY Bilateral 03/03/2019   Procedure: TONSILLECTOMY AND ADENOIDECTOMY;  Surgeon: Leta Baptist, MD;  Location: [place] [facility];  Service: ENT;  Laterality: Bilateral;     OB History   No obstetric history on file.      Home Medications    Prior to Admission medications   Medication Sig Start Date End Date Taking? Authorizing Provider  diphenhydrAMINE (BENADRYL) 25 MG tablet Take 1 tablet (25 mg total) by mouth every 6 (six) hours. 03/16/18   Volanda Napoleon, PA-C  Dulaglutide (TRULICITY) 1.5 0000000 SOPN Inject into the skin once a week.    [provider]  famotidine (PEPCID) 20 MG tablet Take 1 tablet (20 mg total) by mouth 2 (two) times daily. 03/16/18   Volanda Napoleon, PA-C  insulin glargine (LANTUS) 100 UNIT/ML injection Inject 26 Units into the skin 2 (two) times daily.     [provider]  loratadine (CLARITIN) 10 MG tablet Take 10 mg by mouth daily as needed.     [provider]  losartan (COZAAR) 25 MG tablet Take 25 mg by mouth every morning.     [provider]  metoprolol succinate (TOPROL-XL) 50 MG 24 hr tablet Take 50 mg by mouth every morning.  09/02/14   [provider]  mometasone-formoterol (DULERA) 100-5 MCG/ACT AERO Take 2 puffs first thing in am and then another 2 puffs about 12 hours later. Patient taking differently:  Inhale 1 puff into the lungs every morning. Take 2 puffs first thing in am and then another 2 puffs about 12 hours later. 02/24/16   Tanda Rockers, MD  omeprazole (PRILOSEC) 40 MG capsule Take 40 mg by mouth daily.    [provider]  sitaGLIPtin-metformin (JANUMET) 50-1000 MG per tablet Take 1 tablet by mouth 2 (two) times daily.  06/17/13   [provider]  VENTOLIN HFA 108 (90 Base) MCG/ACT inhaler INHALE 2 PUFFS INTO THE LUNGS EVERY 6 (SIX) HOURS AS NEEDED FOR WHEEZING OR SHORTNESS OF BREATH. 07/16/16   Tanda Rockers, MD    Family History Family  History  Problem Relation Age of Onset  . [place] cancer Maternal Aunt   . Allergies Mother   . Asthma Mother     Social History Social History   Tobacco Use  . Smoking status: Former Smoker    Packs/day: 0.50    Years: 20.00    Pack years: 10.00    Types: Cigarettes    Quit date: 08/06/2005    Years since quitting: 13.8  . Smokeless tobacco: Never Used  Substance Use Topics  . Alcohol use: Yes    Alcohol/week: 0.0 standard drinks    Comment: OCCASIONAL  . Drug use: No     Allergies   Morphine and related and Shellfish allergy   Review of Systems Review of Systems  Constitutional: Negative for fever.  HENT: Negative for congestion, rhinorrhea and sneezing.   Eyes: Positive for pain, redness and visual disturbance.  Gastrointestinal: Negative for nausea.  Musculoskeletal: Negative for myalgias and neck stiffness.  Neurological: Negative for headaches.     Physical Exam Updated Vital Signs BP (!) 157/84 (BP Location: Right Arm)   Pulse 87   Temp 98 F (36.7 C) (Oral)   Resp 18   SpO2 96%   Physical Exam Vitals signs and nursing note reviewed.  Constitutional:      Appearance: She is well-developed.  Eyes:     General: Lids are normal. Vision grossly intact. Gaze aligned appropriately.        Right eye: No discharge.        Left eye: No discharge.     Extraocular Movements:     Right eye: Normal extraocular motion.     Left eye: Normal extraocular motion.     Conjunctiva/sclera:     Right eye: Right conjunctiva is not injected. No chemosis, exudate or hemorrhage.    Left eye: Left conjunctiva is not injected. No chemosis, exudate or hemorrhage.    Pupils: Pupils are equal, round, and reactive to light. Pupils are equal.     Funduscopic exam:    Right eye: Red reflex present.        Left eye: Red reflex present.    Comments: Pupils 2-3 mm bilaterally.  Neck:     Musculoskeletal: Normal range of motion.  Pulmonary:     Effort: Pulmonary effort is  normal.  Skin:    General: Skin is warm and dry.  Neurological:     Mental Status: She is alert and oriented to person, place, and time.      ED Treatments / Results  Labs (all labs ordered are listed, but only abnormal results are displayed) Labs Reviewed - No data to display  EKG None  Radiology No results found.  Procedures Procedures (including critical care time) Medications Ordered in ED Medications - No data to display   Initial Impression / Assessment and Plan /  ED Course  I have reviewed the triage vital signs and the nursing notes.  Pertinent labs & imaging results that were available during my care of the patient were reviewed by me and considered in my medical decision making (see chart for details).        Patient to ED with recurrent symptoms of bilateral eye redness, tearing and blurred vision. She is using steroid drops as prescribed by ophtho for symptoms when seen last week.   No sign of infection, chemosis, injury. Visual acuity 10/32 R, 10/40 L.   Symptoms are recurrent symptoms evaluated by ophthalmology this week x 2. Do not suspect acute glaucoma, retinal issue, arterial occlusion. She is encouraged to see her eye doctor (Groat) in the morning to be re-evaluated now that symptoms are present.     Final Clinical Impressions(s) / ED Diagnoses   Final diagnoses:  None   1. Bilateral eye pain  ED Discharge Orders    None       Charlann Lange, PA-C 05/31/19 2340    Sherwood Gambler, MD 06/01/19 1531

## 2019-06-01 DIAGNOSIS — H0520 Unspecified exophthalmos: Secondary | ICD-10-CM | POA: Diagnosis not present

## 2019-06-01 DIAGNOSIS — H169 Unspecified keratitis: Secondary | ICD-10-CM | POA: Diagnosis not present

## 2019-06-01 NOTE — ED Notes (Signed)
Patient verbalizes understanding of discharge instructions. Opportunity for questioning and answers were provided. Armband removed by staff, pt discharged from ED.  

## 2019-06-03 DIAGNOSIS — E113412 Type 2 diabetes mellitus with severe nonproliferative diabetic retinopathy with macular edema, left eye: Secondary | ICD-10-CM | POA: Diagnosis not present

## 2019-06-03 DIAGNOSIS — E113492 Type 2 diabetes mellitus with severe nonproliferative diabetic retinopathy without macular edema, left eye: Secondary | ICD-10-CM | POA: Diagnosis not present

## 2019-06-03 DIAGNOSIS — H16141 Punctate keratitis, right eye: Secondary | ICD-10-CM | POA: Diagnosis not present

## 2019-06-05 DIAGNOSIS — H169 Unspecified keratitis: Secondary | ICD-10-CM | POA: Diagnosis not present

## 2019-06-05 DIAGNOSIS — H0520 Unspecified exophthalmos: Secondary | ICD-10-CM | POA: Diagnosis not present

## 2019-07-10 DIAGNOSIS — R070 Pain in throat: Secondary | ICD-10-CM | POA: Diagnosis not present

## 2019-07-10 DIAGNOSIS — K219 Gastro-esophageal reflux disease without esophagitis: Secondary | ICD-10-CM | POA: Diagnosis not present

## 2019-07-24 DIAGNOSIS — J45909 Unspecified asthma, uncomplicated: Secondary | ICD-10-CM | POA: Diagnosis not present

## 2019-07-24 DIAGNOSIS — K219 Gastro-esophageal reflux disease without esophagitis: Secondary | ICD-10-CM | POA: Diagnosis not present

## 2019-07-24 DIAGNOSIS — E1140 Type 2 diabetes mellitus with diabetic neuropathy, unspecified: Secondary | ICD-10-CM | POA: Diagnosis not present

## 2019-07-24 DIAGNOSIS — E119 Type 2 diabetes mellitus without complications: Secondary | ICD-10-CM | POA: Diagnosis not present

## 2019-07-24 DIAGNOSIS — Z789 Other specified health status: Secondary | ICD-10-CM | POA: Diagnosis not present

## 2019-07-24 DIAGNOSIS — D500 Iron deficiency anemia secondary to blood loss (chronic): Secondary | ICD-10-CM | POA: Diagnosis not present

## 2019-07-24 DIAGNOSIS — E785 Hyperlipidemia, unspecified: Secondary | ICD-10-CM | POA: Diagnosis not present

## 2019-07-24 DIAGNOSIS — I10 Essential (primary) hypertension: Secondary | ICD-10-CM | POA: Diagnosis not present

## 2019-07-24 DIAGNOSIS — I119 Hypertensive heart disease without heart failure: Secondary | ICD-10-CM | POA: Diagnosis not present

## 2019-08-10 DIAGNOSIS — E785 Hyperlipidemia, unspecified: Secondary | ICD-10-CM | POA: Diagnosis not present

## 2019-08-10 DIAGNOSIS — I10 Essential (primary) hypertension: Secondary | ICD-10-CM | POA: Diagnosis not present

## 2019-08-10 DIAGNOSIS — E1140 Type 2 diabetes mellitus with diabetic neuropathy, unspecified: Secondary | ICD-10-CM | POA: Diagnosis not present

## 2019-08-10 DIAGNOSIS — E119 Type 2 diabetes mellitus without complications: Secondary | ICD-10-CM | POA: Diagnosis not present

## 2019-08-10 DIAGNOSIS — E875 Hyperkalemia: Secondary | ICD-10-CM | POA: Diagnosis not present

## 2019-08-10 DIAGNOSIS — K219 Gastro-esophageal reflux disease without esophagitis: Secondary | ICD-10-CM | POA: Diagnosis not present

## 2019-08-10 DIAGNOSIS — I119 Hypertensive heart disease without heart failure: Secondary | ICD-10-CM | POA: Diagnosis not present

## 2019-08-10 DIAGNOSIS — D500 Iron deficiency anemia secondary to blood loss (chronic): Secondary | ICD-10-CM | POA: Diagnosis not present

## 2019-08-10 DIAGNOSIS — J45909 Unspecified asthma, uncomplicated: Secondary | ICD-10-CM | POA: Diagnosis not present

## 2019-10-05 DIAGNOSIS — E119 Type 2 diabetes mellitus without complications: Secondary | ICD-10-CM | POA: Diagnosis not present

## 2019-10-05 DIAGNOSIS — I119 Hypertensive heart disease without heart failure: Secondary | ICD-10-CM | POA: Diagnosis not present

## 2019-10-05 DIAGNOSIS — K219 Gastro-esophageal reflux disease without esophagitis: Secondary | ICD-10-CM | POA: Diagnosis not present

## 2019-10-05 DIAGNOSIS — E785 Hyperlipidemia, unspecified: Secondary | ICD-10-CM | POA: Diagnosis not present

## 2019-10-05 DIAGNOSIS — E1140 Type 2 diabetes mellitus with diabetic neuropathy, unspecified: Secondary | ICD-10-CM | POA: Diagnosis not present

## 2019-10-05 DIAGNOSIS — D500 Iron deficiency anemia secondary to blood loss (chronic): Secondary | ICD-10-CM | POA: Diagnosis not present

## 2019-10-05 DIAGNOSIS — J45909 Unspecified asthma, uncomplicated: Secondary | ICD-10-CM | POA: Diagnosis not present

## 2019-10-05 DIAGNOSIS — I10 Essential (primary) hypertension: Secondary | ICD-10-CM | POA: Diagnosis not present

## 2019-10-05 DIAGNOSIS — Z0001 Encounter for general adult medical examination with abnormal findings: Secondary | ICD-10-CM | POA: Diagnosis not present

## 2019-10-14 DIAGNOSIS — H169 Unspecified keratitis: Secondary | ICD-10-CM | POA: Diagnosis not present

## 2019-10-14 DIAGNOSIS — H0520 Unspecified exophthalmos: Secondary | ICD-10-CM | POA: Diagnosis not present

## 2019-12-08 DIAGNOSIS — M25572 Pain in left ankle and joints of left foot: Secondary | ICD-10-CM | POA: Diagnosis not present

## 2019-12-08 DIAGNOSIS — M25571 Pain in right ankle and joints of right foot: Secondary | ICD-10-CM | POA: Diagnosis not present

## 2019-12-08 DIAGNOSIS — M21611 Bunion of right foot: Secondary | ICD-10-CM | POA: Diagnosis not present

## 2019-12-08 DIAGNOSIS — G5761 Lesion of plantar nerve, right lower limb: Secondary | ICD-10-CM | POA: Diagnosis not present

## 2019-12-08 DIAGNOSIS — M21612 Bunion of left foot: Secondary | ICD-10-CM | POA: Diagnosis not present

## 2019-12-09 ENCOUNTER — Ambulatory Visit (INDEPENDENT_AMBULATORY_CARE_PROVIDER_SITE_OTHER): Payer: Medicare HMO | Admitting: Ophthalmology

## 2019-12-09 ENCOUNTER — Encounter (INDEPENDENT_AMBULATORY_CARE_PROVIDER_SITE_OTHER): Payer: Self-pay | Admitting: Ophthalmology

## 2019-12-09 ENCOUNTER — Other Ambulatory Visit: Payer: Self-pay

## 2019-12-09 DIAGNOSIS — E113412 Type 2 diabetes mellitus with severe nonproliferative diabetic retinopathy with macular edema, left eye: Secondary | ICD-10-CM

## 2019-12-09 DIAGNOSIS — Z961 Presence of intraocular lens: Secondary | ICD-10-CM

## 2019-12-09 DIAGNOSIS — E113491 Type 2 diabetes mellitus with severe nonproliferative diabetic retinopathy without macular edema, right eye: Secondary | ICD-10-CM | POA: Diagnosis not present

## 2019-12-09 DIAGNOSIS — E113492 Type 2 diabetes mellitus with severe nonproliferative diabetic retinopathy without macular edema, left eye: Secondary | ICD-10-CM | POA: Insufficient documentation

## 2019-12-09 NOTE — Progress Notes (Signed)
12/09/2019     CHIEF COMPLAINT Patient presents for Diabetic Eye Exam   HISTORY OF PRESENT ILLNESS: Leslie Scott is a 54 y.o. female who presents to the clinic today for:   HPI    Diabetic Eye Exam    Vision is stable.  Associated Symptoms Negative for Flashes and Floaters.  Diabetes characteristics include Type 2.  Blood sugar level is controlled.  Last Blood Glucose 143.  Last A1C 8.          Comments    7 month follow up- OCT OU Patient denies change in vision and overall has no complaints. LBS 143 this AM A1C 12 November 2019       Last edited by Gerda Diss on 12/09/2019 10:34 AM. (History)      Referring physician: Benito Mccreedy, MD [id number] [place],  [place] [address]  HISTORICAL INFORMATION:   Selected notes from the [facility]: No current outpatient medications on file. (Ophthalmic Drugs)   No current facility-administered medications for this visit. (Ophthalmic Drugs)   Current Outpatient Medications (Other)  Medication Sig  . diphenhydrAMINE (BENADRYL) 25 MG tablet Take 1 tablet (25 mg total) by mouth every 6 (six) hours.  . Dulaglutide (TRULICITY) 1.5 0000000 SOPN Inject into the skin once a week.  . famotidine (PEPCID) 20 MG tablet Take 1 tablet (20 mg total) by mouth 2 (two) times daily.  . insulin glargine (LANTUS) 100 UNIT/ML injection Inject 26 Units into the skin 2 (two) times daily.   Marland Kitchen loratadine (CLARITIN) 10 MG tablet Take 10 mg by mouth daily as needed.   Marland Kitchen losartan (COZAAR) 25 MG tablet Take 25 mg by mouth every morning.   . metoprolol succinate (TOPROL-XL) 50 MG 24 hr tablet Take 50 mg by mouth every morning.   . mometasone-formoterol (DULERA) 100-5 MCG/ACT AERO Take 2 puffs first thing in am and then another 2 puffs about 12 hours later. (Patient taking differently: Inhale 1 puff into the lungs every morning. Take 2 puffs first thing in am and then another 2 puffs about 12 hours later.)   . omeprazole (PRILOSEC) 40 MG capsule Take 40 mg by mouth daily.  . sitaGLIPtin-metformin (JANUMET) 50-1000 MG per tablet Take 1 tablet by mouth 2 (two) times daily.   . VENTOLIN HFA 108 (90 Base) MCG/ACT inhaler INHALE 2 PUFFS INTO THE LUNGS EVERY 6 (SIX) HOURS AS NEEDED FOR WHEEZING OR SHORTNESS OF BREATH.   No current facility-administered medications for this visit. (Other)      REVIEW OF SYSTEMS:    ALLERGIES Allergies  Allergen Reactions  . Morphine And Related Shortness Of Breath  . Shellfish Allergy Anaphylaxis    ALL SEAFOOD    PAST MEDICAL HISTORY Past Medical History:  Diagnosis Date  . Adenotonsillar hypertrophy   . Arthritis    shoulders  . GERD (gastroesophageal reflux disease)   . Hypertension   . Mild persistent asthma    pulmologist-  dr wert  . OSA (obstructive sleep apnea)    per pt no cpap  . Palpitations   . SUI (stress urinary incontinence, female)   . Type 2 diabetes mellitus treated with insulin ([place])    followed by pcp   Past Surgical History:  Procedure Laterality Date  . ABDOMINOPLASTY  2017   w/ Lipo  . BREAST ENHANCEMENT SURGERY Bilateral 2001   and Abdominoplasty  . CATARACT EXTRACTION W/ INTRAOCULAR LENS  IMPLANT, BILATERAL  2013  . CYSTOSCOPY N/A 01/27/2018   Procedure: CYSTOSCOPY;  Surgeon: Franchot Gallo, MD;  Location: [facility];  Service: Urology;  Laterality: N/A;  . PUBOVAGINAL SLING N/A 01/27/2018   Procedure: Gaynelle Arabian, Janyth Pupa;  Surgeon: Franchot Gallo, MD;  Location: [facility];  Service: Urology;  Laterality: N/A;  . TONSILLECTOMY AND ADENOIDECTOMY Bilateral 03/03/2019   Procedure: TONSILLECTOMY AND ADENOIDECTOMY;  Surgeon: Leta Baptist, MD;  Location: [facility];  Service: ENT;  Laterality: Bilateral;    FAMILY HISTORY Family History  Problem Relation Age of Onset  . Swe[place] cancer Maternal Aunt   . Allergies Mother   . Asthma Mother     SOCIAL  HISTORY Social History   Tobacco Use  . Smoking status: Former Smoker    Packs/day: 0.50    Years: 20.00    Pack years: 10.00    Types: Cigarettes    Quit date: 08/06/2005    Years since quitting: 14.3  . Smokeless tobacco: Never Used  Substance Use Topics  . Alcohol use: Yes    Alcohol/week: 0.0 standard drinks    Comment: OCCASIONAL  . Drug use: No         OPHTHALMIC EXAM:  Base Eye Exam    Visual Acuity (Snellen - Linear)      Right Left   Dist [place] 20/50+2 20/20   Dist ph [place] 20/20        Tonometry (Tonopen, 10:38 AM)      Right Left   Pressure 17 16       Pupils      Pupils Dark Light Shape React APD   Right PERRL 4 3 Round Brisk None   Left PERRL 4 3 Round Brisk None       Visual Fields (Counting fingers)      Left Right    Full Full       Extraocular Movement      Right Left    Full Full       Neuro/Psych    Oriented x3: Yes   Mood/Affect: Normal       Dilation    Both eyes: 1.0% Mydriacyl, 2.5% Phenylephrine @ 10:38 AM        Slit Lamp and Fundus Exam    External Exam      Right Left   External Normal Normal       Slit Lamp Exam      Right Left   Lids/Lashes Normal Normal   Conjunctiva/Sclera White and quiet White and quiet   Cornea Clear Clear   Anterior Chamber Deep and quiet Deep and quiet   Iris Round and reactive Round and reactive   Lens Posterior chamber intraocular lens Posterior chamber intraocular lens   Anterior Vitreous Normal Normal       Fundus Exam      Right Left   C/D Ratio 0.3 0.3          IMAGING AND PROCEDURES  Imaging and Procedures for 12/09/19  OCT, Retina - OU - Both Eyes       Right Eye Quality was good. Scan locations included subfoveal. Central Foveal Thickness: 257. Progression has been stable. Findings include normal observations.   Left Eye Quality was good. Scan locations included subfoveal. Central Foveal Thickness: 266. Progression has worsened. Findings include cystoid macular edema.    Notes OS with micro-CME, in the foveal and perifoveal region particular temporally.  This may be diabetic maculopathy and will need  fluorescein angiographic evaluation and study                ASSESSMENT/PLAN:  Severe nonproliferative diabetic retinopathy of left eye, with macular edema, associated with type 2 diabetes mellitus ([place])  The nature of diabetic macular edema was discussed with the patient. Treatment options were outlined including medical therapy, laser & vitrectomy. The use of injectable medications reviewed, including Avastin, Lucentis, and Eylea. Periodic injections into the eye are likely to resolve diabetic macular edema (swelling in the center of vision). Initially, injections are delivered are delivered every 4-6 weeks, and the interval extended as the condition improves. On average, 8-9 injections the first year, and 5 in year 2. Improvement in the condition most often improves on medical therapy. Occasional use of focal laser is also recommended for residual macular edema (swelling). Excellent control of blood glucose and blood pressure are encouraged under the care of a primary physician or endocrinologist. Similarly, attempts to maintain serum cholesterol, low density lipoproteins, and high-density lipoproteins in a favorable range were recommended.       ICD-10-CM   1. Severe nonproliferative diabetic retinopathy of left eye, with macular edema, associated with type 2 diabetes mellitus (HCC)  [id number] OCT, Retina - OU - Both Eyes  2. Severe nonproliferative diabetic retinopathy of right eye without macular edema associated with type 2 diabetes mellitus ([place])  E11.3491   3. Pseudophakia of both eyes  Z96.1     1.  Will plan repeat evaluation via fundus fluorescein angiography OU, consideration of intravitreal Avastin OS  2.  3.  Ophthalmic Meds Ordered this visit:  No orders of the defined types were placed in this encounter.      Return in about 2 weeks  (around 12/23/2019) for DILATE OU, OPTOS FFA L/R, COLOR FP.  There are no Patient Instructions on file for this visit.   Explained the diagnoses, plan, and follow up with the patient and they expressed understanding.  Patient expressed understanding of the importance of proper follow up care.   Clent Demark Zayin Valadez M.D. Diseases & Surgery of the Retina and Vitreous Retina & Diabetic [place] 12/09/19     Abbreviations: M myopia (nearsighted); A astigmatism; H hyperopia (farsighted); P presbyopia; Mrx spectacle prescription;  CTL contact lenses; OD right eye; OS left eye; OU both eyes  XT exotropia; ET esotropia; PEK punctate epithelial keratitis; PEE punctate epithelial erosions; DES dry eye syndrome; MGD meibomian gland dysfunction; ATs artificial tears; PFAT's preservative free artificial tears; [place] nuclear sclerotic cataract; PSC posterior subcapsular cataract; ERM epi-retinal membrane; PVD posterior vitreous detachment; RD retinal detachment; DM diabetes mellitus; DR diabetic retinopathy; NPDR non-proliferative diabetic retinopathy; PDR proliferative diabetic retinopathy; CSME clinically significant macular edema; DME diabetic macular edema; dbh dot blot hemorrhages; CWS cotton wool spot; POAG primary open angle glaucoma; C/D cup-to-disc ratio; HVF humphrey visual field; GVF goldmann visual field; OCT optical coherence tomography; IOP intraocular pressure; BRVO Branch retinal vein occlusion; CRVO central retinal vein occlusion; CRAO central retinal artery occlusion; BRAO branch retinal artery occlusion; RT retinal tear; SB scleral buckle; PPV pars plana vitrectomy; VH Vitreous hemorrhage; PRP panretinal laser photocoagulation; IVK intravitreal kenalog; VMT vitreomacular traction; MH Macular hole;  NVD neovascularization of the disc; NVE neovascularization elsewhere; AREDS age related eye disease study; ARMD age related macular degeneration; POAG primary open angle glaucoma; EBMD epithelial/anterior  basement membrane dystrophy; ACIOL anterior chamber intraocular lens; IOL intraocular lens; PCIOL posterior chamber intraocular lens; Phaco/IOL phacoemulsification with intraocular lens placement; PRK photorefractive keratectomy; LASIK laser  assisted in situ keratomileusis; HTN hypertension; DM diabetes mellitus; COPD chronic obstructive pulmonary disease

## 2019-12-09 NOTE — Assessment & Plan Note (Signed)
The nature of diabetic macular edema was discussed with the patient. Treatment options were outlined including medical therapy, laser & vitrectomy. The use of injectable medications reviewed, including Avastin, Lucentis, and Eylea. Periodic injections into the eye are likely to resolve diabetic macular edema (swelling in the center of vision). Initially, injections are delivered are delivered every 4-6 weeks, and the interval extended as the condition improves. On average, 8-9 injections the first year, and 5 in year 2. Improvement in the condition most often improves on medical therapy. Occasional use of focal laser is also recommended for residual macular edema (swelling). Excellent control of blood glucose and blood pressure are encouraged under the care of a primary physician or endocrinologist. Similarly, attempts to maintain serum cholesterol, low density lipoproteins, and high-density lipoproteins in a favorable range were recommended.

## 2019-12-21 DIAGNOSIS — G5761 Lesion of plantar nerve, right lower limb: Secondary | ICD-10-CM | POA: Diagnosis not present

## 2019-12-24 ENCOUNTER — Other Ambulatory Visit: Payer: Self-pay

## 2019-12-24 ENCOUNTER — Encounter (INDEPENDENT_AMBULATORY_CARE_PROVIDER_SITE_OTHER): Payer: Self-pay | Admitting: Ophthalmology

## 2019-12-24 ENCOUNTER — Ambulatory Visit (INDEPENDENT_AMBULATORY_CARE_PROVIDER_SITE_OTHER): Payer: Medicare HMO | Admitting: Ophthalmology

## 2019-12-24 DIAGNOSIS — E113412 Type 2 diabetes mellitus with severe nonproliferative diabetic retinopathy with macular edema, left eye: Secondary | ICD-10-CM | POA: Diagnosis not present

## 2019-12-24 DIAGNOSIS — E113411 Type 2 diabetes mellitus with severe nonproliferative diabetic retinopathy with macular edema, right eye: Secondary | ICD-10-CM | POA: Diagnosis not present

## 2019-12-24 NOTE — Progress Notes (Signed)
12/24/2019     CHIEF COMPLAINT Patient presents for Retina Follow Up   HISTORY OF PRESENT ILLNESS: Leslie Scott is a 54 y.o. female who presents to the clinic today for:   HPI    Retina Follow Up    Patient presents with  Diabetic Retinopathy.  In left eye.  This started 2 weeks ago.  Severity is mild.  Duration of 2 weeks.  Since onset it is stable.          Comments    2 Week Diabetic F/U OU, FP/FFA L/R  Pt denies noticeable changes to [place] OU since last visit. Pt denies ocular pain, flashes of light, or floaters OU.  LBS: 95 this AM       Last edited by Rockie Neighbours, [place] on 12/24/2019  8:16 AM. (History)      Referring physician: Benito Mccreedy, MD [id number] [place],  [place] [address]  HISTORICAL INFORMATION:   Selected notes from the [facility]: No current outpatient medications on file. (Ophthalmic Drugs)   No current facility-administered medications for this visit. (Ophthalmic Drugs)   Current Outpatient Medications (Other)  Medication Sig  . diphenhydrAMINE (BENADRYL) 25 MG tablet Take 1 tablet (25 mg total) by mouth every 6 (six) hours.  . Dulaglutide (TRULICITY) 1.5 0000000 SOPN Inject into the skin once a week.  . famotidine (PEPCID) 20 MG tablet Take 1 tablet (20 mg total) by mouth 2 (two) times daily.  . insulin glargine (LANTUS) 100 UNIT/ML injection Inject 26 Units into the skin 2 (two) times daily.   Marland Kitchen loratadine (CLARITIN) 10 MG tablet Take 10 mg by mouth daily as needed.   Marland Kitchen losartan (COZAAR) 25 MG tablet Take 25 mg by mouth every morning.   . metoprolol succinate (TOPROL-XL) 50 MG 24 hr tablet Take 50 mg by mouth every morning.   . mometasone-formoterol (DULERA) 100-5 MCG/ACT AERO Take 2 puffs first thing in am and then another 2 puffs about 12 hours later. (Patient taking differently: Inhale 1 puff into the lungs every morning. Take 2 puffs first thing in am and then another 2 puffs about  12 hours later.)  . omeprazole (PRILOSEC) 40 MG capsule Take 40 mg by mouth daily.  . sitaGLIPtin-metformin (JANUMET) 50-1000 MG per tablet Take 1 tablet by mouth 2 (two) times daily.   . VENTOLIN HFA 108 (90 Base) MCG/ACT inhaler INHALE 2 PUFFS INTO THE LUNGS EVERY 6 (SIX) HOURS AS NEEDED FOR WHEEZING OR SHORTNESS OF BREATH.   No current facility-administered medications for this visit. (Other)      REVIEW OF SYSTEMS:    ALLERGIES Allergies  Allergen Reactions  . Morphine And Related Shortness Of Breath  . Shellfish Allergy Anaphylaxis    ALL SEAFOOD    PAST MEDICAL HISTORY Past Medical History:  Diagnosis Date  . Adenotonsillar hypertrophy   . Arthritis    shoulders  . GERD (gastroesophageal reflux disease)   . Hypertension   . Mild persistent asthma    pulmologist-  dr wert  . OSA (obstructive sleep apnea)    per pt no cpap  . Palpitations   . SUI (stress urinary incontinence, female)   . Type 2 diabetes mellitus treated with insulin ([place])    followed by pcp   Past Surgical History:  Procedure Laterality Date  . ABDOMINOPLASTY  2017   w/ Lipo  . BREAST ENHANCEMENT SURGERY Bilateral 2001   and Abdominoplasty  .  CATARACT EXTRACTION W/ INTRAOCULAR LENS  IMPLANT, BILATERAL  2013  . CYSTOSCOPY N/A 01/27/2018   Procedure: CYSTOSCOPY;  Surgeon: Franchot Gallo, MD;  Location: [facility];  Service: Urology;  Laterality: N/A;  . PUBOVAGINAL SLING N/A 01/27/2018   Procedure: Gaynelle Arabian, Janyth Pupa;  Surgeon: Franchot Gallo, MD;  Location[facility];  Service: Urology;  Laterality: N/A;  . TONSILLECTOMY AND ADENOIDECTOMY Bilateral 03/03/2019   Procedure: TONSILLECTOMY AND ADENOIDECTOMY;  Surgeon: Leta Baptist, MD;  Location: [facility];  Service: ENT;  Laterality: Bilateral;    FAMILY HISTORY Family History  Problem Relation Age of Onset  . [place] cancer Maternal Aunt   . Allergies Mother   . Asthma Mother      SOCIAL HISTORY Social History   Tobacco Use  . Smoking status: Former Smoker    Packs/day: 0.50    Years: 20.00    Pack years: 10.00    Types: Cigarettes    Quit date: 08/06/2005    Years since quitting: 14.3  . Smokeless tobacco: Never Used  Substance Use Topics  . Alcohol use: Yes    Alcohol/week: 0.0 standard drinks    Comment: OCCASIONAL  . Drug use: No         OPHTHALMIC EXAM: Base Eye Exam    Visual Acuity (ETDRS)      Right Left   Dist [place] 20/50 20/20 -2   Dist ph  20/30 -2        Tonometry (Tonopen, 8:19 AM)      Right Left   Pressure 21 20       Pupils      Pupils Dark Light Shape React APD   Right PERRL 4 3 Round Brisk None   Left PERRL 4 3 Round Brisk None       Visual Fields (Counting fingers)      Left Right    Full Full       Extraocular Movement      Right Left    Full Full       Neuro/Psych    Oriented x3: Yes   Mood/Affect: Normal       Dilation    Both eyes: 1.0% Mydriacyl, 2.5% Phenylephrine @ 8:19 AM        Slit Lamp and Fundus Exam    External Exam      Right Left   External Normal Normal       Slit Lamp Exam      Right Left   Lids/Lashes Normal Normal   Conjunctiva/Sclera White and quiet White and quiet   Cornea Clear Clear   Anterior Chamber Deep and quiet Deep and quiet   Iris Round and reactive Round and reactive   Lens Posterior chamber intraocular lens Posterior chamber intraocular lens   Anterior Vitreous Normal Normal       Fundus Exam      Right Left   C/D Ratio 0.3 0.3          IMAGING AND PROCEDURES  Imaging and Procedures for 12/24/19           ASSESSMENT/PLAN:  No problem-specific Assessment & Plan notes found for this encounter.      ICD-10-CM   1. Severe nonproliferative diabetic retinopathy of left eye, with macular edema, associated with type 2 diabetes mellitus (HCC)  [id number] Fluorescein Angiography Optos (Transit OS)    Color Fundus Photography Optos - OU - Both Eyes     1.Clinically significant macular  edema present temporally OS.  This confirms the area of leakage is noted on OCT examination previous.  We will plan for intravitreal anti-VEGF, Avastin OS soon followed thereafter by local, focal laser photocoagulation for more permanent control of the CSME  2.  3.  Ophthalmic Meds Ordered this visit:  No orders of the defined types were placed in this encounter.      No follow-ups on file.  There are no Patient Instructions on file for this visit.   Explained the diagnoses, plan, and follow up with the patient and they expressed understanding.  Patient expressed understanding of the importance of proper follow up care.   Clent Demark Rankin M.D. Diseases & Surgery of the Retina and Vitreous Retina & Diabetic [place] 12/24/19     Abbreviations: M myopia (nearsighted); A astigmatism; H hyperopia (farsighted); P presbyopia; Mrx spectacle prescription;  CTL contact lenses; OD right eye; OS left eye; OU both eyes  XT exotropia; ET esotropia; PEK punctate epithelial keratitis; PEE punctate epithelial erosions; DES dry eye syndrome; MGD meibomian gland dysfunction; ATs artificial tears; PFAT's preservative free artificial tears; [place] nuclear sclerotic cataract; PSC posterior subcapsular cataract; ERM epi-retinal membrane; PVD posterior vitreous detachment; RD retinal detachment; DM diabetes mellitus; DR diabetic retinopathy; NPDR non-proliferative diabetic retinopathy; PDR proliferative diabetic retinopathy; CSME clinically significant macular edema; DME diabetic macular edema; dbh dot blot hemorrhages; CWS cotton wool spot; POAG primary open angle glaucoma; C/D cup-to-disc ratio; HVF humphrey visual field; GVF goldmann visual field; OCT optical coherence tomography; IOP intraocular pressure; BRVO Branch retinal vein occlusion; CRVO central retinal vein occlusion; CRAO central retinal artery occlusion; BRAO branch retinal artery occlusion; RT retinal tear; SB  scleral buckle; PPV pars plana vitrectomy; VH Vitreous hemorrhage; PRP panretinal laser photocoagulation; IVK intravitreal kenalog; VMT vitreomacular traction; MH Macular hole;  NVD neovascularization of the disc; NVE neovascularization elsewhere; AREDS age related eye disease study; ARMD age related macular degeneration; POAG primary open angle glaucoma; EBMD epithelial/anterior basement membrane dystrophy; ACIOL anterior chamber intraocular lens; IOL intraocular lens; PCIOL posterior chamber intraocular lens; Phaco/IOL phacoemulsification with intraocular lens placement; [place] photorefractive keratectomy; LASIK laser assisted in situ keratomileusis; HTN hypertension; DM diabetes mellitus; COPD chronic obstructive pulmonary disease

## 2019-12-24 NOTE — Assessment & Plan Note (Signed)
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  NPDR OU needs to be monitored closely

## 2019-12-28 ENCOUNTER — Other Ambulatory Visit: Payer: Self-pay

## 2019-12-28 ENCOUNTER — Encounter (INDEPENDENT_AMBULATORY_CARE_PROVIDER_SITE_OTHER): Payer: Self-pay | Admitting: Ophthalmology

## 2019-12-28 ENCOUNTER — Ambulatory Visit (INDEPENDENT_AMBULATORY_CARE_PROVIDER_SITE_OTHER): Payer: Medicare HMO | Admitting: Ophthalmology

## 2019-12-28 DIAGNOSIS — E113412 Type 2 diabetes mellitus with severe nonproliferative diabetic retinopathy with macular edema, left eye: Secondary | ICD-10-CM

## 2019-12-28 MED ORDER — BEVACIZUMAB CHEMO INJECTION 1.25MG/0.05ML SYRINGE FOR KALEIDOSCOPE
1.2500 mg | INTRAVITREAL | Status: AC | PRN
  Administered 2019-12-28: 1.25 mg via INTRAVITREAL

## 2019-12-28 NOTE — Progress Notes (Signed)
12/28/2019     CHIEF COMPLAINT Patient presents for Retina Follow Up   HISTORY OF PRESENT ILLNESS: Leslie Scott is a 54 y.o. female who presents to the clinic today for:   HPI    Retina Follow Up    Patient presents with  Diabetic Retinopathy.  In left eye.  Duration of 1 week.  Since onset it is stable.          Comments    1 week follow up- OCT OU, Possible Avastin OS - No dilate Patient denies change in vision and overall has no complaints. LBS 154 this AM A1C 8.0 Feb 2021       Last edited by Gerda Diss on 12/28/2019  3:12 PM. (History)      Referring physician: Benito Mccreedy, MD [id number] [place],  [place] [id number]  HISTORICAL INFORMATION:   Selected notes from the MEDICAL RECORD NUMBER       CURRENT MEDICATIONS: No current outpatient medications on file. (Ophthalmic Drugs)   No current facility-administered medications for this visit. (Ophthalmic Drugs)   Current Outpatient Medications (Other)  Medication Sig  . diphenhydrAMINE (BENADRYL) 25 MG tablet Take 1 tablet (25 mg total) by mouth every 6 (six) hours.  . Dulaglutide (TRULICITY) 1.5 0000000 SOPN Inject into the skin once a week.  . famotidine (PEPCID) 20 MG tablet Take 1 tablet (20 mg total) by mouth 2 (two) times daily.  . insulin glargine (LANTUS) 100 UNIT/ML injection Inject 26 Units into the skin 2 (two) times daily.   Marland Kitchen loratadine (CLARITIN) 10 MG tablet Take 10 mg by mouth daily as needed.   Marland Kitchen losartan (COZAAR) 25 MG tablet Take 25 mg by mouth every morning.   . metoprolol succinate (TOPROL-XL) 50 MG 24 hr tablet Take 50 mg by mouth every morning.   . mometasone-formoterol (DULERA) 100-5 MCG/ACT AERO Take 2 puffs first thing in am and then another 2 puffs about 12 hours later. (Patient taking differently: Inhale 1 puff into the lungs every morning. Take 2 puffs first thing in am and then another 2 puffs about 12 hours later.)  . omeprazole (PRILOSEC) 40 MG capsule Take 40  mg by mouth daily.  . sitaGLIPtin-metformin (JANUMET) 50-1000 MG per tablet Take 1 tablet by mouth 2 (two) times daily.   . VENTOLIN HFA 108 (90 Base) MCG/ACT inhaler INHALE 2 PUFFS INTO THE LUNGS EVERY 6 (SIX) HOURS AS NEEDED FOR WHEEZING OR SHORTNESS OF BREATH.   No current facility-administered medications for this visit. (Other)      REVIEW OF SYSTEMS:    ALLERGIES Allergies  Allergen Reactions  . Morphine And Related Shortness Of Breath  . Shellfish Allergy Anaphylaxis    ALL SEAFOOD    PAST MEDICAL HISTORY Past Medical History:  Diagnosis Date  . Adenotonsillar hypertrophy   . Arthritis    shoulders  . GERD (gastroesophageal reflux disease)   . Hypertension   . Mild persistent asthma    pulmologist-  dr wert  . OSA (obstructive sleep apnea)    per pt no cpap  . Palpitations   . SUI (stress urinary incontinence, female)   . Type 2 diabetes mellitus treated with insulin ([place])    followed by pcp   Past Surgical History:  Procedure Laterality Date  . ABDOMINOPLASTY  2017   w/ Lipo  . BREAST ENHANCEMENT SURGERY Bilateral 2001   and Abdominoplasty  . CATARACT EXTRACTION W/ INTRAOCULAR LENS  IMPLANT, BILATERAL  2013  .  CYSTOSCOPY N/A 01/27/2018   Procedure: CYSTOSCOPY;  Surgeon: Franchot Gallo, MD;  Location: [facility];  Service: Urology;  Laterality: N/A;  . PUBOVAGINAL SLING N/A 01/27/2018   Procedure: Gaynelle Arabian, Janyth Pupa;  Surgeon: Franchot Gallo, MD;  Location: [facility];  Service: Urology;  Laterality: N/A;  . TONSILLECTOMY AND ADENOIDECTOMY Bilateral 03/03/2019   Procedure: TONSILLECTOMY AND ADENOIDECTOMY;  Surgeon: Leta Baptist, MD;  Location: [place];  Service: ENT;  Laterality: Bilateral;    FAMILY HISTORY Family History  Problem Relation Age of Onset  . [place] cancer Maternal Aunt   . Allergies Mother   . Asthma Mother     SOCIAL HISTORY Social History   Tobacco Use  . Smoking  status: Former Smoker    Packs/day: 0.50    Years: 20.00    Pack years: 10.00    Types: Cigarettes    Quit date: 08/06/2005    Years since quitting: 14.4  . Smokeless tobacco: Never Used  Substance Use Topics  . Alcohol use: Yes    Alcohol/week: 0.0 standard drinks    Comment: OCCASIONAL  . Drug use: No         OPHTHALMIC EXAM:  Base Eye Exam    Visual Acuity (Snellen - Linear)      Right Left   Dist [place] 20/30-2 20/20   Dist ph [place] 20/25-2        Tonometry (Tonopen, 3:16 PM)      Right Left   Pressure 17 15       Neuro/Psych    Oriented x3: Yes   Mood/Affect: Normal        Slit Lamp and Fundus Exam    External Exam      Right Left   External Normal Normal       Slit Lamp Exam      Right Left   Lids/Lashes Normal Normal   Conjunctiva/Sclera White and quiet White and quiet   Cornea Clear Clear   Anterior Chamber Deep and quiet Deep and quiet   Iris Round and reactive Round and reactive   Lens Posterior chamber intraocular lens Posterior chamber intraocular lens   Vitreous Normal Normal          IMAGING AND PROCEDURES  Imaging and Procedures for 12/28/19  OCT, Retina - OU - Both Eyes       Right Eye Quality was good. Scan locations included subfoveal. Central Foveal Thickness: 259. Findings include normal observations.   Left Eye Quality was good. Scan locations included subfoveal. Central Foveal Thickness: 268.   Notes The CSME, inferotemporal to fovea, overall minor leakage OS.  Will need intravitreal Avastin OS today And plan for focal Lan for focal laser in the coming weeks       Intravitreal Injection, Pharmacologic Agent - OS - Left Eye       Time Out 12/28/2019. 4:24 PM. Confirmed correct patient, procedure, site, and patient consented.   Anesthesia Topical anesthesia was used. Anesthetic medications included [place] 3.5%.   Procedure Preparation included Tobramycin 0.3%, 10% betadine to eyelids. A 30 gauge needle was used.    Injection:  1.25 mg Bevacizumab (AVASTIN) SOLN   NDC: [id number], [id number]   Route: Intravitreal, Site: Left Eye, Waste: 0 mg  Post-op Post injection exam found visual acuity of at least counting fingers. The patient tolerated the procedure well. There were no complications. The patient received written and verbal post procedure care education. Post injection medications were not  given.                 ASSESSMENT/PLAN:  No problem-specific Assessment & Plan notes found for this encounter.      ICD-10-CM   1. Severe nonproliferative diabetic retinopathy of left eye, with macular edema, associated with type 2 diabetes mellitus (HCC)  [id number] OCT, Retina - OU - Both Eyes    Intravitreal Injection, Pharmacologic Agent - OS - Left Eye    Bevacizumab (AVASTIN) SOLN 1.25 mg    1.  Intravitreal Avastin OS today.  2.  We will schedule focal laser photocoagulation to the region inferotemporal to the fovea.  3.  Ophthalmic Meds Ordered this visit:  Meds ordered this encounter  Medications  . Bevacizumab (AVASTIN) SOLN 1.25 mg       Return in about 3 weeks (around 01/18/2020) for dilate, OS, FOCAL.  There are no Patient Instructions on file for this visit.   Explained the diagnoses, plan, and follow up with the patient and they expressed understanding.  Patient expressed understanding of the importance of proper follow up care.   Clent Demark Yariel Ferraris M.D. Diseases & Surgery of the Retina and Vitreous Retina & Diabetic [place] 12/28/19     Abbreviations: M myopia (nearsighted); A astigmatism; H hyperopia (farsighted); P presbyopia; Mrx spectacle prescription;  CTL contact lenses; OD right eye; OS left eye; OU both eyes  XT exotropia; ET esotropia; PEK punctate epithelial keratitis; PEE punctate epithelial erosions; DES dry eye syndrome; MGD meibomian gland dysfunction; ATs artificial tears; PFAT's preservative free artificial tears; [place] nuclear sclerotic cataract; PSC  posterior subcapsular cataract; ERM epi-retinal membrane; PVD posterior vitreous detachment; RD retinal detachment; DM diabetes mellitus; DR diabetic retinopathy; NPDR non-proliferative diabetic retinopathy; PDR proliferative diabetic retinopathy; CSME clinically significant macular edema; DME diabetic macular edema; dbh dot blot hemorrhages; CWS cotton wool spot; POAG primary open angle glaucoma; C/D cup-to-disc ratio; HVF humphrey visual field; GVF goldmann visual field; OCT optical coherence tomography; IOP intraocular pressure; BRVO Branch retinal vein occlusion; CRVO central retinal vein occlusion; CRAO central retinal artery occlusion; BRAO branch retinal artery occlusion; RT retinal tear; SB scleral buckle; PPV pars plana vitrectomy; VH Vitreous hemorrhage; PRP panretinal laser photocoagulation; IVK intravitreal kenalog; VMT vitreomacular traction; MH Macular hole;  NVD neovascularization of the disc; NVE neovascularization elsewhere; AREDS age related eye disease study; ARMD age related macular degeneration; POAG primary open angle glaucoma; EBMD epithelial/anterior basement membrane dystrophy; ACIOL anterior chamber intraocular lens; IOL intraocular lens; PCIOL posterior chamber intraocular lens; Phaco/IOL phacoemulsification with intraocular lens placement; [place] photorefractive keratectomy; LASIK laser assisted in situ keratomileusis; HTN hypertension; DM diabetes mellitus; COPD chronic obstructive pulmonary disease

## 2019-12-30 DIAGNOSIS — Z1231 Encounter for screening mammogram for malignant neoplasm of breast: Secondary | ICD-10-CM | POA: Diagnosis not present

## 2020-01-08 DIAGNOSIS — E1140 Type 2 diabetes mellitus with diabetic neuropathy, unspecified: Secondary | ICD-10-CM | POA: Diagnosis not present

## 2020-01-08 DIAGNOSIS — J45909 Unspecified asthma, uncomplicated: Secondary | ICD-10-CM | POA: Diagnosis not present

## 2020-01-08 DIAGNOSIS — E119 Type 2 diabetes mellitus without complications: Secondary | ICD-10-CM | POA: Diagnosis not present

## 2020-01-08 DIAGNOSIS — K219 Gastro-esophageal reflux disease without esophagitis: Secondary | ICD-10-CM | POA: Diagnosis not present

## 2020-01-08 DIAGNOSIS — E785 Hyperlipidemia, unspecified: Secondary | ICD-10-CM | POA: Diagnosis not present

## 2020-01-08 DIAGNOSIS — D500 Iron deficiency anemia secondary to blood loss (chronic): Secondary | ICD-10-CM | POA: Diagnosis not present

## 2020-01-08 DIAGNOSIS — Z0000 Encounter for general adult medical examination without abnormal findings: Secondary | ICD-10-CM | POA: Diagnosis not present

## 2020-01-08 DIAGNOSIS — I10 Essential (primary) hypertension: Secondary | ICD-10-CM | POA: Diagnosis not present

## 2020-01-08 DIAGNOSIS — I119 Hypertensive heart disease without heart failure: Secondary | ICD-10-CM | POA: Diagnosis not present

## 2020-01-21 ENCOUNTER — Ambulatory Visit (INDEPENDENT_AMBULATORY_CARE_PROVIDER_SITE_OTHER): Payer: Medicare HMO | Admitting: Ophthalmology

## 2020-01-21 ENCOUNTER — Encounter (INDEPENDENT_AMBULATORY_CARE_PROVIDER_SITE_OTHER): Payer: Self-pay | Admitting: Ophthalmology

## 2020-01-21 ENCOUNTER — Other Ambulatory Visit: Payer: Self-pay

## 2020-01-21 DIAGNOSIS — E113412 Type 2 diabetes mellitus with severe nonproliferative diabetic retinopathy with macular edema, left eye: Secondary | ICD-10-CM | POA: Diagnosis not present

## 2020-01-21 NOTE — Progress Notes (Signed)
01/21/2020     CHIEF COMPLAINT Patient presents for Retina Follow Up   HISTORY OF PRESENT ILLNESS: Leslie Scott is a 54 y.o. female who presents to the clinic today for:   HPI    Retina Follow Up    Patient presents with  Diabetic Retinopathy.  In left eye.  Severity is moderate.  Duration of 3.5 weeks.  Since onset it is stable.  I, the attending physician,  performed the HPI with the patient and updated documentation appropriately.          Comments    3.5 Week f\u OS. Focal OS. OCT  Pt states no issues or concerns. BGL: 135       Last edited by Tilda Franco on 01/21/2020  8:54 AM. (History)      Referring physician: Benito Mccreedy, MD [address] [place],   [id number]  HISTORICAL INFORMATION:   Selected notes from the [place]: No current outpatient medications on file. (Ophthalmic Drugs)   No current facility-administered medications for this visit. (Ophthalmic Drugs)   Current Outpatient Medications (Other)  Medication Sig  . diphenhydrAMINE (BENADRYL) 25 MG tablet Take 1 tablet (25 mg total) by mouth every 6 (six) hours.  . Dulaglutide (TRULICITY) 1.5 MI/6.8EH SOPN Inject into the skin once a week.  . famotidine (PEPCID) 20 MG tablet Take 1 tablet (20 mg total) by mouth 2 (two) times daily.  . insulin glargine (LANTUS) 100 UNIT/ML injection Inject 26 Units into the skin 2 (two) times daily.   Marland Kitchen loratadine (CLARITIN) 10 MG tablet Take 10 mg by mouth daily as needed.   Marland Kitchen losartan (COZAAR) 25 MG tablet Take 25 mg by mouth every morning.   . metoprolol succinate (TOPROL-XL) 50 MG 24 hr tablet Take 50 mg by mouth every morning.   . mometasone-formoterol (DULERA) 100-5 MCG/ACT AERO Take 2 puffs first thing in am and then another 2 puffs about 12 hours later. (Patient taking differently: Inhale 1 puff into the lungs every morning. Take 2 puffs first thing in am and then another 2 puffs about 12 hours  later.)  . omeprazole (PRILOSEC) 40 MG capsule Take 40 mg by mouth daily.  . sitaGLIPtin-metformin (JANUMET) 50-1000 MG per tablet Take 1 tablet by mouth 2 (two) times daily.   . VENTOLIN HFA 108 (90 Base) MCG/ACT inhaler INHALE 2 PUFFS INTO THE LUNGS EVERY 6 (SIX) HOURS AS NEEDED FOR WHEEZING OR SHORTNESS OF BREATH.   No current facility-administered medications for this visit. (Other)      REVIEW OF SYSTEMS:    ALLERGIES Allergies  Allergen Reactions  . Morphine And Related Shortness Of Breath  . Shellfish Allergy Anaphylaxis    ALL SEAFOOD    PAST MEDICAL HISTORY Past Medical History:  Diagnosis Date  . Adenotonsillar hypertrophy   . Arthritis    shoulders  . GERD (gastroesophageal reflux disease)   . Hypertension   . Mild persistent asthma    pulmologist-  dr wert  . OSA (obstructive sleep apnea)    per pt no cpap  . Palpitations   . SUI (stress urinary incontinence, female)   . Type 2 diabetes mellitus treated with insulin ([place])    followed by pcp   Past Surgical History:  Procedure Laterality Date  . ABDOMINOPLASTY  2017   w/ Lipo  . BREAST ENHANCEMENT SURGERY Bilateral 2001   and Abdominoplasty  . CATARACT EXTRACTION W/ INTRAOCULAR LENS  IMPLANT, BILATERAL  2013  . CYSTOSCOPY N/A 01/27/2018   Procedure: CYSTOSCOPY;  Surgeon: Franchot Gallo, MD;  Location[facility];  Service: Urology;  Laterality: N/A;  . PUBOVAGINAL SLING N/A 01/27/2018   Procedure: Gaynelle Arabian, Janyth Pupa;  Surgeon: Franchot Gallo, MD;  Location: [facility];  Service: Urology;  Laterality: N/A;  . TONSILLECTOMY AND ADENOIDECTOMY Bilateral 03/03/2019   Procedure: TONSILLECTOMY AND ADENOIDECTOMY;  Surgeon: Leta Baptist, MD;  Location: [place];  Service: ENT;  Laterality: Bilateral;    FAMILY HISTORY Family History  Problem Relation Age of Onset  . [place] cancer Maternal Aunt   . Allergies Mother   . Asthma Mother     SOCIAL  HISTORY Social History   Tobacco Use  . Smoking status: Former Smoker    Packs/day: 0.50    Years: 20.00    Pack years: 10.00    Types: Cigarettes    Quit date: 08/06/2005    Years since quitting: 14.4  . Smokeless tobacco: Never Used  Vaping Use  . Vaping Use: Never used  Substance Use Topics  . Alcohol use: Yes    Alcohol/week: 0.0 standard drinks    Comment: OCCASIONAL  . Drug use: No         OPHTHALMIC EXAM: Base Eye Exam    Visual Acuity (Snellen - Linear)      Right Left   Dist [place] 20/40 -1 20/25 -1   Dist ph [place] 20/25 -2        Tonometry (Tonopen, 8:58 AM)      Right Left   Pressure 20 20       Pupils      Pupils Dark Light Shape React APD   Right PERRL 3 3 Round Minimal None   Left PERRL 2 2 Round Minimal None       Neuro/Psych    Oriented x3: Yes   Mood/Affect: Normal       Dilation    Left eye: 1.0% Mydriacyl, 2.5% Phenylephrine @ 8:58 AM        Slit Lamp and Fundus Exam    External Exam      Right Left   External Normal Normal       Slit Lamp Exam      Right Left   Lids/Lashes Normal Normal   Conjunctiva/Sclera White and quiet White and quiet   Cornea Clear Clear   Anterior Chamber Deep and quiet Deep and quiet   Iris Round and reactive Round and reactive   Lens Posterior chamber intraocular lens Posterior chamber intraocular lens   Anterior Vitreous Normal Normal       Fundus Exam      Right Left   Posterior Vitreous  Normal   Disc  Normal   C/D Ratio  0.3   Macula  Exudates, Mild clinically significant macular edema   Vessels  NPDR-Severe   Periphery  Normal          IMAGING AND PROCEDURES  Imaging and Procedures for 01/21/20  OCT, Retina - OU - Both Eyes       Right Eye Quality was good. Scan locations included subfoveal. Central Foveal Thickness: 257. Progression has been stable.   Left Eye Quality was good. Scan locations included subfoveal. Central Foveal Thickness: 266. Progression has been stable.    Notes Minor CSME OS, focal laser treatment today       Focal Laser - OS - Left Eye  Time Out Confirmed correct patient, procedure, site, and patient consented.   Anesthesia Topical anesthesia was used. Anesthetic medications included Proparacaine 0.5%.   Laser Information The type of laser was diode. Color was yellow. The duration in seconds was 0.05. The spot size was 100 microns. Laser power was 70. Total spots was 43.   Post-op The patient tolerated the procedure well. There were no complications. The patient received written and verbal post procedure care education.   Notes Focal applied sn, and inf temp to faz                ASSESSMENT/PLAN:  No problem-specific Assessment & Plan notes found for this encounter.      ICD-10-CM   1. Severe nonproliferative diabetic retinopathy of left eye, with macular edema, associated with type 2 diabetes mellitus (HCC)  J00.9381 OCT, Retina - OU - Both Eyes    Focal Laser - OS - Left Eye    1.  Focal laser applied today OS, to diminish treatment burden with intravitreal injections.  2.  3.  Ophthalmic Meds Ordered this visit:  No orders of the defined types were placed in this encounter.      Return in about 4 months (around 05/22/2020) for DILATE OU, OCT.  There are no Patient Instructions on file for this visit.   Explained the diagnoses, plan, and follow up with the patient and they expressed understanding.  Patient expressed understanding of the importance of proper follow up care.   Clent Demark Ignacia Gentzler M.D. Diseases & Surgery of the Retina and Vitreous Retina & Diabetic [place] 01/21/20     Abbreviations: M myopia (nearsighted); A astigmatism; H hyperopia (farsighted); P presbyopia; Mrx spectacle prescription;  CTL contact lenses; OD right eye; OS left eye; OU both eyes  XT exotropia; ET esotropia; PEK punctate epithelial keratitis; PEE punctate epithelial erosions; DES dry eye syndrome; MGD  meibomian gland dysfunction; ATs artificial tears; PFAT's preservative free artificial tears; [place] nuclear sclerotic cataract; PSC posterior subcapsular cataract; ERM epi-retinal membrane; PVD posterior vitreous detachment; RD retinal detachment; DM diabetes mellitus; DR diabetic retinopathy; NPDR non-proliferative diabetic retinopathy; PDR proliferative diabetic retinopathy; CSME clinically significant macular edema; DME diabetic macular edema; dbh dot blot hemorrhages; CWS cotton wool spot; POAG primary open angle glaucoma; C/D cup-to-disc ratio; HVF humphrey visual field; GVF goldmann visual field; OCT optical coherence tomography; IOP intraocular pressure; BRVO Branch retinal vein occlusion; CRVO central retinal vein occlusion; CRAO central retinal artery occlusion; BRAO branch retinal artery occlusion; RT retinal tear; SB scleral buckle; PPV pars plana vitrectomy; VH Vitreous hemorrhage; PRP panretinal laser photocoagulation; IVK intravitreal kenalog; VMT vitreomacular traction; MH Macular hole;  NVD neovascularization of the disc; NVE neovascularization elsewhere; AREDS age related eye disease study; ARMD age related macular degeneration; POAG primary open angle glaucoma; EBMD epithelial/anterior basement membrane dystrophy; ACIOL anterior chamber intraocular lens; IOL intraocular lens; PCIOL posterior chamber intraocular lens; Phaco/IOL phacoemulsification with intraocular lens placement; [place] photorefractive keratectomy; LASIK laser assisted in situ keratomileusis; HTN hypertension; DM diabetes mellitus; COPD chronic obstructive pulmonary disease

## 2020-01-22 DIAGNOSIS — E1140 Type 2 diabetes mellitus with diabetic neuropathy, unspecified: Secondary | ICD-10-CM | POA: Diagnosis not present

## 2020-01-22 DIAGNOSIS — K219 Gastro-esophageal reflux disease without esophagitis: Secondary | ICD-10-CM | POA: Diagnosis not present

## 2020-01-22 DIAGNOSIS — I10 Essential (primary) hypertension: Secondary | ICD-10-CM | POA: Diagnosis not present

## 2020-01-22 DIAGNOSIS — J45909 Unspecified asthma, uncomplicated: Secondary | ICD-10-CM | POA: Diagnosis not present

## 2020-01-22 DIAGNOSIS — D500 Iron deficiency anemia secondary to blood loss (chronic): Secondary | ICD-10-CM | POA: Diagnosis not present

## 2020-01-22 DIAGNOSIS — E785 Hyperlipidemia, unspecified: Secondary | ICD-10-CM | POA: Diagnosis not present

## 2020-01-22 DIAGNOSIS — I119 Hypertensive heart disease without heart failure: Secondary | ICD-10-CM | POA: Diagnosis not present

## 2020-01-22 DIAGNOSIS — E119 Type 2 diabetes mellitus without complications: Secondary | ICD-10-CM | POA: Diagnosis not present

## 2020-01-28 DIAGNOSIS — M25511 Pain in right shoulder: Secondary | ICD-10-CM | POA: Diagnosis not present

## 2020-01-28 DIAGNOSIS — M542 Cervicalgia: Secondary | ICD-10-CM | POA: Diagnosis not present

## 2020-01-28 DIAGNOSIS — Z79899 Other long term (current) drug therapy: Secondary | ICD-10-CM | POA: Diagnosis not present

## 2020-01-28 DIAGNOSIS — M545 Low back pain: Secondary | ICD-10-CM | POA: Diagnosis not present

## 2020-02-11 DIAGNOSIS — M545 Low back pain: Secondary | ICD-10-CM | POA: Diagnosis not present

## 2020-02-11 DIAGNOSIS — Z79899 Other long term (current) drug therapy: Secondary | ICD-10-CM | POA: Diagnosis not present

## 2020-02-11 DIAGNOSIS — M542 Cervicalgia: Secondary | ICD-10-CM | POA: Diagnosis not present

## 2020-02-27 IMAGING — CR DG CHEST 2V
2 series · 2 of 2 positions shown · non-contrast
Comparison: 01/16/2016 chest CT

CLINICAL DATA: Cough and shortness of breath for 8 days.

EXAM:
CHEST - 2 VIEW

[chest pa]
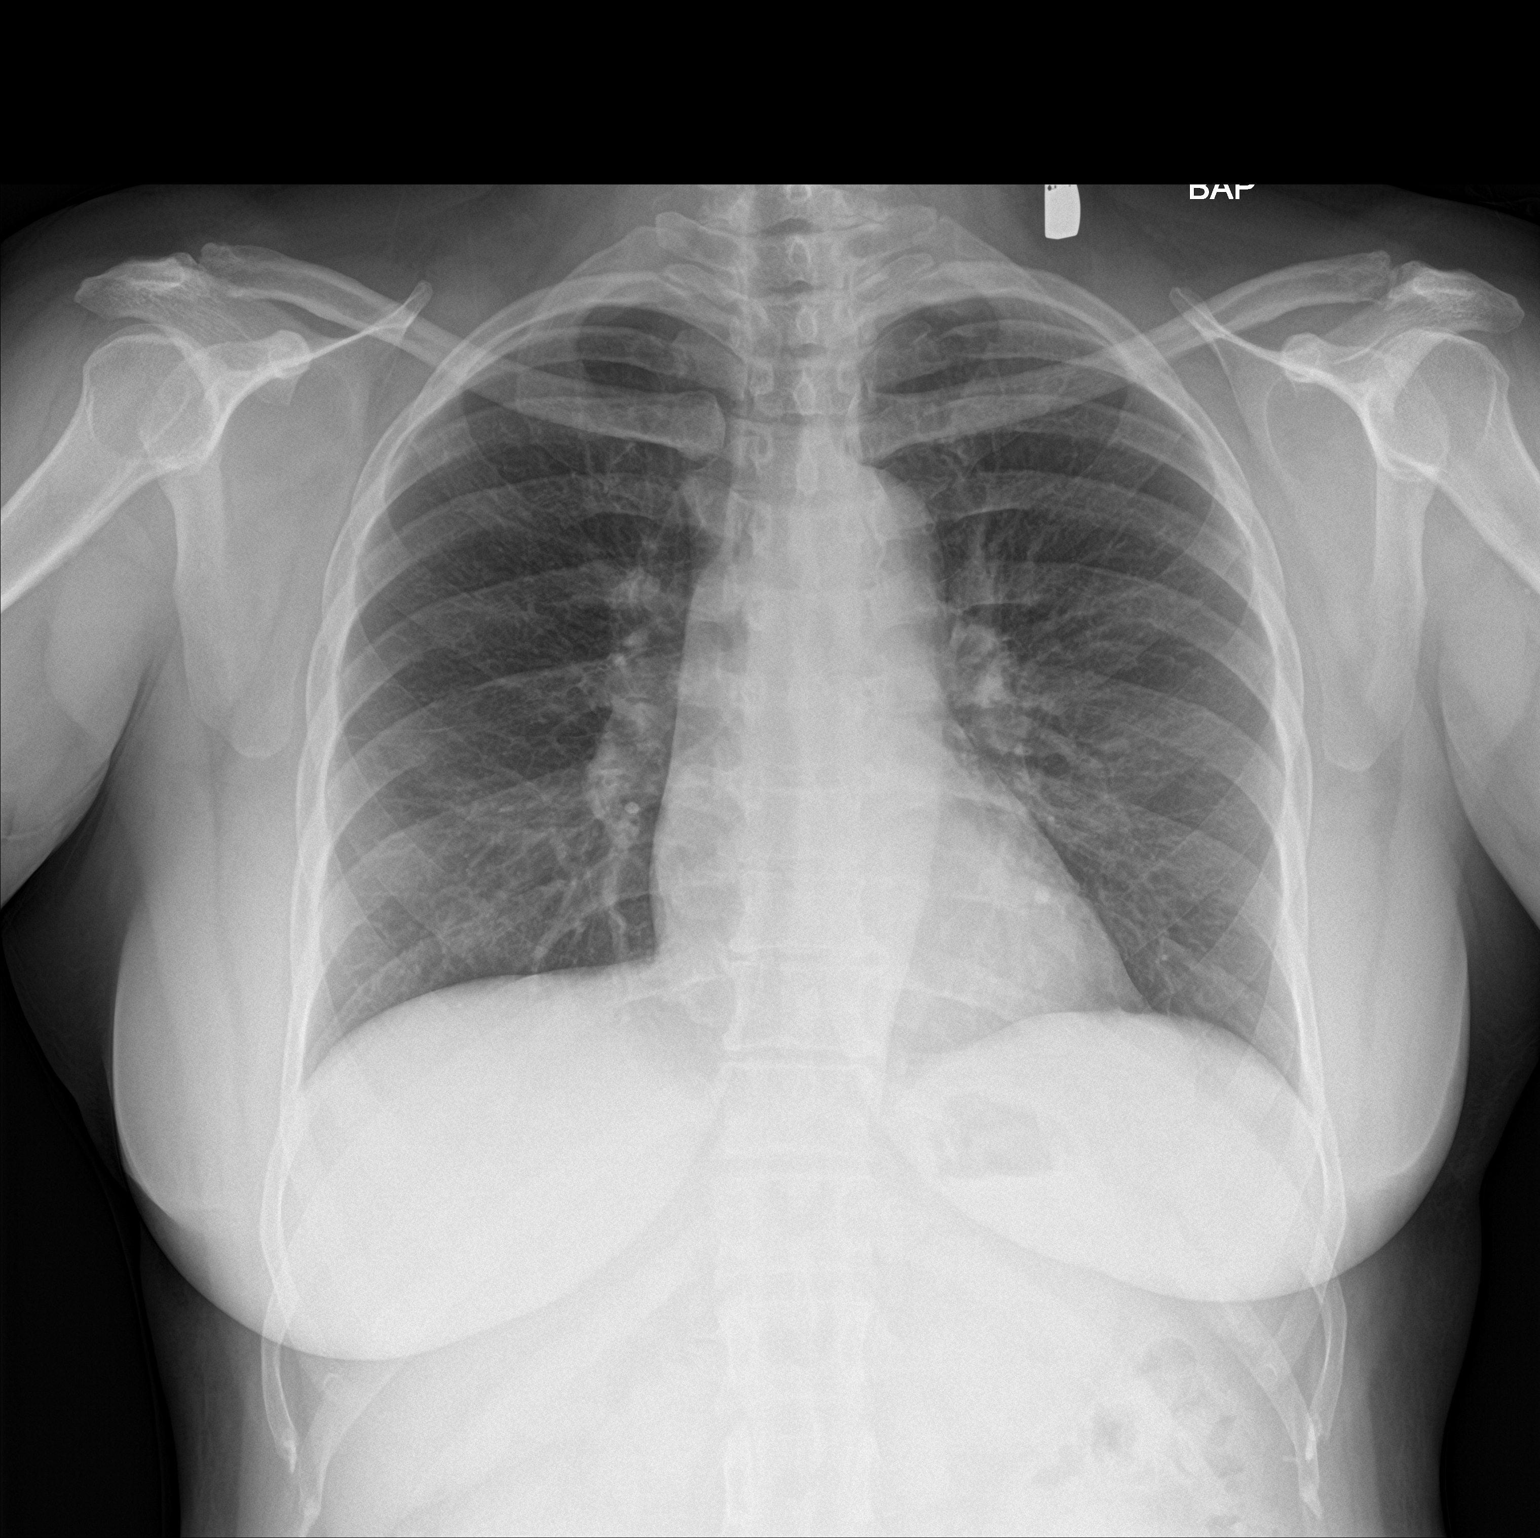

[chest lat]
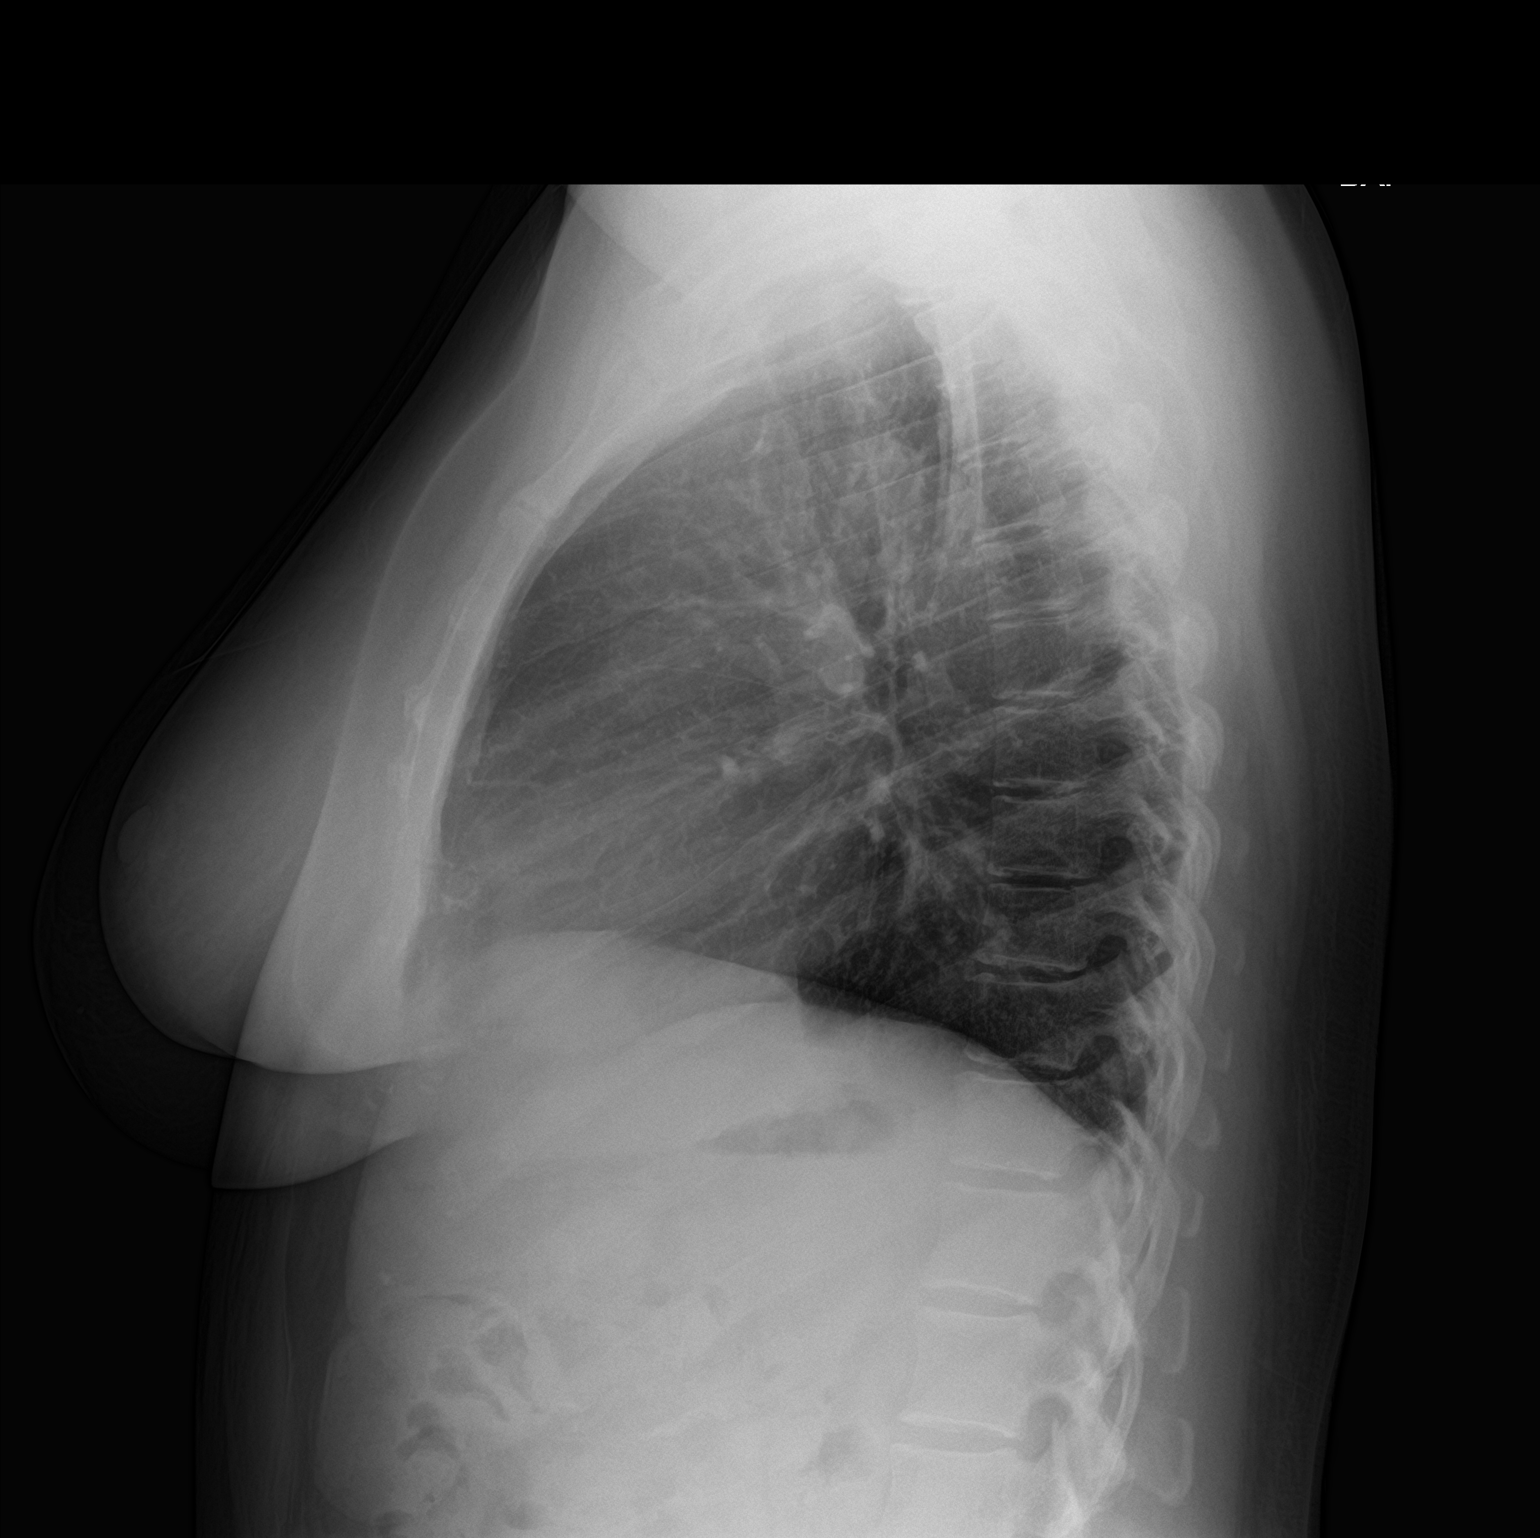

[2 of 2 positions shown; findings below may reference images not displayed]

FINDINGS: The cardiomediastinal silhouette is unremarkable.

Mild peribronchial thickening noted.

There is no evidence of focal airspace disease, pulmonary edema,
suspicious pulmonary nodule/mass, pleural effusion, or pneumothorax.

No acute bony abnormalities are identified.
IMPRESSION: Mild peribronchial thickening without focal pneumonia.

## 2020-03-28 DIAGNOSIS — I10 Essential (primary) hypertension: Secondary | ICD-10-CM | POA: Diagnosis not present

## 2020-03-28 DIAGNOSIS — E119 Type 2 diabetes mellitus without complications: Secondary | ICD-10-CM | POA: Diagnosis not present

## 2020-03-28 DIAGNOSIS — D500 Iron deficiency anemia secondary to blood loss (chronic): Secondary | ICD-10-CM | POA: Diagnosis not present

## 2020-03-28 DIAGNOSIS — I119 Hypertensive heart disease without heart failure: Secondary | ICD-10-CM | POA: Diagnosis not present

## 2020-03-28 DIAGNOSIS — B373 Candidiasis of vulva and vagina: Secondary | ICD-10-CM | POA: Diagnosis not present

## 2020-03-28 DIAGNOSIS — E1140 Type 2 diabetes mellitus with diabetic neuropathy, unspecified: Secondary | ICD-10-CM | POA: Diagnosis not present

## 2020-03-28 DIAGNOSIS — E785 Hyperlipidemia, unspecified: Secondary | ICD-10-CM | POA: Diagnosis not present

## 2020-03-28 DIAGNOSIS — J45909 Unspecified asthma, uncomplicated: Secondary | ICD-10-CM | POA: Diagnosis not present

## 2020-03-28 DIAGNOSIS — K219 Gastro-esophageal reflux disease without esophagitis: Secondary | ICD-10-CM | POA: Diagnosis not present

## 2020-04-08 DIAGNOSIS — J45909 Unspecified asthma, uncomplicated: Secondary | ICD-10-CM | POA: Diagnosis not present

## 2020-04-08 DIAGNOSIS — E785 Hyperlipidemia, unspecified: Secondary | ICD-10-CM | POA: Diagnosis not present

## 2020-04-08 DIAGNOSIS — E119 Type 2 diabetes mellitus without complications: Secondary | ICD-10-CM | POA: Diagnosis not present

## 2020-04-08 DIAGNOSIS — E1140 Type 2 diabetes mellitus with diabetic neuropathy, unspecified: Secondary | ICD-10-CM | POA: Diagnosis not present

## 2020-04-08 DIAGNOSIS — I119 Hypertensive heart disease without heart failure: Secondary | ICD-10-CM | POA: Diagnosis not present

## 2020-04-08 DIAGNOSIS — D500 Iron deficiency anemia secondary to blood loss (chronic): Secondary | ICD-10-CM | POA: Diagnosis not present

## 2020-04-08 DIAGNOSIS — K219 Gastro-esophageal reflux disease without esophagitis: Secondary | ICD-10-CM | POA: Diagnosis not present

## 2020-04-08 DIAGNOSIS — I10 Essential (primary) hypertension: Secondary | ICD-10-CM | POA: Diagnosis not present

## 2020-05-13 DIAGNOSIS — Z23 Encounter for immunization: Secondary | ICD-10-CM | POA: Diagnosis not present

## 2020-05-23 ENCOUNTER — Encounter (INDEPENDENT_AMBULATORY_CARE_PROVIDER_SITE_OTHER): Payer: Medicare HMO | Admitting: Ophthalmology

## 2020-06-03 DIAGNOSIS — Z20822 Contact with and (suspected) exposure to covid-19: Secondary | ICD-10-CM | POA: Diagnosis not present

## 2020-06-03 DIAGNOSIS — Z03818 Encounter for observation for suspected exposure to other biological agents ruled out: Secondary | ICD-10-CM | POA: Diagnosis not present

## 2020-06-07 ENCOUNTER — Other Ambulatory Visit: Payer: Self-pay

## 2020-06-07 ENCOUNTER — Ambulatory Visit (INDEPENDENT_AMBULATORY_CARE_PROVIDER_SITE_OTHER): Payer: Medicare HMO | Admitting: Ophthalmology

## 2020-06-07 ENCOUNTER — Encounter (INDEPENDENT_AMBULATORY_CARE_PROVIDER_SITE_OTHER): Payer: Self-pay | Admitting: Ophthalmology

## 2020-06-07 DIAGNOSIS — E113412 Type 2 diabetes mellitus with severe nonproliferative diabetic retinopathy with macular edema, left eye: Secondary | ICD-10-CM

## 2020-06-07 DIAGNOSIS — E113493 Type 2 diabetes mellitus with severe nonproliferative diabetic retinopathy without macular edema, bilateral: Secondary | ICD-10-CM

## 2020-06-07 DIAGNOSIS — E113492 Type 2 diabetes mellitus with severe nonproliferative diabetic retinopathy without macular edema, left eye: Secondary | ICD-10-CM

## 2020-06-07 DIAGNOSIS — E113491 Type 2 diabetes mellitus with severe nonproliferative diabetic retinopathy without macular edema, right eye: Secondary | ICD-10-CM

## 2020-06-07 NOTE — Progress Notes (Signed)
06/07/2020     CHIEF COMPLAINT Patient presents for Retina Follow Up   HISTORY OF PRESENT ILLNESS: Leslie Scott is a 54 y.o. female who presents to the clinic today for:   HPI    Retina Follow Up    Patient presents with  Diabetic Retinopathy.  In both eyes.  This started 4 months ago.  Severity is mild.  Duration of 4 months.  Since onset it is stable.          Comments    4 Month F/U OU  Pt denies noticeable changes to [place] OU since last visit. Pt denies ocular pain, flashes of light, or floaters OU.  A1c: 8.1, 02/2020 approx LBS: 134 yesterday       Last edited by Rockie Neighbours, [place] on 06/07/2020 10:00 AM. (History)      Referring physician: Benito Mccreedy, MD [address] [place],  [place] [address]  HISTORICAL INFORMATION:   Selected notes from the [place]: No current outpatient medications on file. (Ophthalmic Drugs)   No current facility-administered medications for this visit. (Ophthalmic Drugs)   Current Outpatient Medications (Other)  Medication Sig   diphenhydrAMINE (BENADRYL) 25 MG tablet Take 1 tablet (25 mg total) by mouth every 6 (six) hours.   Dulaglutide (TRULICITY) 1.5 YB/0.1BP SOPN Inject into the skin once a week.   famotidine (PEPCID) 20 MG tablet Take 1 tablet (20 mg total) by mouth 2 (two) times daily.   insulin glargine (LANTUS) 100 UNIT/ML injection Inject 26 Units into the skin 2 (two) times daily.    loratadine (CLARITIN) 10 MG tablet Take 10 mg by mouth daily as needed.    losartan (COZAAR) 25 MG tablet Take 25 mg by mouth every morning.    metoprolol succinate (TOPROL-XL) 50 MG 24 hr tablet Take 50 mg by mouth every morning.    mometasone-formoterol (DULERA) 100-5 MCG/ACT AERO Take 2 puffs first thing in am and then another 2 puffs about 12 hours later. (Patient taking differently: Inhale 1 puff into the lungs every morning. Take 2 puffs first thing in am and then another 2  puffs about 12 hours later.)   omeprazole (PRILOSEC) 40 MG capsule Take 40 mg by mouth daily.   sitaGLIPtin-metformin (JANUMET) 50-1000 MG per tablet Take 1 tablet by mouth 2 (two) times daily.    VENTOLIN HFA 108 (90 Base) MCG/ACT inhaler INHALE 2 PUFFS INTO THE LUNGS EVERY 6 (SIX) HOURS AS NEEDED FOR WHEEZING OR SHORTNESS OF BREATH.   No current facility-administered medications for this visit. (Other)      REVIEW OF SYSTEMS:    ALLERGIES Allergies  Allergen Reactions   Morphine And Related Shortness Of Breath   Shellfish Allergy Anaphylaxis    ALL SEAFOOD    PAST MEDICAL HISTORY Past Medical History:  Diagnosis Date   Adenotonsillar hypertrophy    Arthritis    shoulders   GERD (gastroesophageal reflux disease)    Hypertension    Mild persistent asthma    pulmologist-  dr wert   OSA (obstructive sleep apnea)    per pt no cpap   Palpitations    SUI (stress urinary incontinence, female)    Type 2 diabetes mellitus treated with insulin ([place])    followed by pcp   Past Surgical History:  Procedure Laterality Date   ABDOMINOPLASTY  2017   w/ Lipo   BREAST ENHANCEMENT SURGERY Bilateral 2001   and Abdominoplasty  CATARACT EXTRACTION W/ INTRAOCULAR LENS  IMPLANT, BILATERAL  2013   CYSTOSCOPY N/A 01/27/2018   Procedure: CYSTOSCOPY;  Surgeon: Franchot Gallo, MD;  Location: [facility];  Service: Urology;  Laterality: N/A;   PUBOVAGINAL SLING N/A 01/27/2018   Procedure: Gaynelle Arabian, Janyth Pupa;  Surgeon: Franchot Gallo, MD;  Location: [facility];  Service: Urology;  Laterality: N/A;   TONSILLECTOMY AND ADENOIDECTOMY Bilateral 03/03/2019   Procedure: TONSILLECTOMY AND ADENOIDECTOMY;  Surgeon: Leta Baptist, MD;  Location: [facility];  Service: ENT;  Laterality: Bilateral;    FAMILY HISTORY Family History  Problem Relation Age of Onset   [place] cancer Maternal Aunt    Allergies Mother    Asthma  Mother     SOCIAL HISTORY Social History   Tobacco Use   Smoking status: Former Smoker    Packs/day: 0.50    Years: 20.00    Pack years: 10.00    Types: Cigarettes    Quit date: 08/06/2005    Years since quitting: 14.8   Smokeless tobacco: Never Used  Vaping Use   Vaping Use: Never used  Substance Use Topics   Alcohol use: Yes    Alcohol/week: 0.0 standard drinks    Comment: OCCASIONAL   Drug use: No         OPHTHALMIC EXAM:  Base Eye Exam    Visual Acuity (ETDRS)      Right Left   Dist [place] 20/40 -1 20/20   Dist ph  20/20 -1        Tonometry (Tonopen, 10:00 AM)      Right Left   Pressure 22 23       Pupils      Dark Light Shape React APD   Right 4 4 Round Minimal None   Left 3 3 Round Minimal None       Visual Fields (Counting fingers)      Left Right    Full Full       Extraocular Movement      Right Left    Full Full       Neuro/Psych    Oriented x3: Yes   Mood/Affect: Normal       Dilation    Both eyes: 1.0% Mydriacyl, 2.5% Phenylephrine @ 10:03 AM        Slit Lamp and Fundus Exam    External Exam      Right Left   External Normal Normal       Slit Lamp Exam      Right Left   Lids/Lashes Normal Normal   Conjunctiva/Sclera White and quiet White and quiet   Cornea Clear Clear   Anterior Chamber Deep and quiet Deep and quiet   Iris Round and reactive Round and reactive   Lens Posterior chamber intraocular lens Posterior chamber intraocular lens   Anterior Vitreous Normal Normal       Fundus Exam      Right Left   Posterior Vitreous Normal Normal   Disc Normal Normal   C/D Ratio 0.25 0.3   Macula Microaneurysms, no clinically significant macular edema no exudates, no clinically significant macular edema, Microaneurysms   Vessels NPDR-Severe NPDR-Severe   Periphery Normal Normal          IMAGING AND PROCEDURES  Imaging and Procedures for 06/07/20  OCT, Retina - OU - Both Eyes       Right Eye Quality was good.  Scan locations included subfoveal. Central Foveal Thickness: 262. Progression  has been stable. Findings include normal foveal contour.   Left Eye Quality was good. Scan locations included subfoveal. Central Foveal Thickness: 263. Progression has been stable. Findings include normal foveal contour.   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 Severe nonproliferative diabetic retinopathy of left eye, with macular edema, associated with type 2 diabetes mellitus (HCC)  P53.6144 OCT, Retina - OU - Both Eyes  2. 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 Severe nonproliferative diabetic retinopathy of left eye without macular edema associated with type 2 diabetes mellitus ([place])  R15.4008     1.Observe, no active therapy required.  2.  3.  Ophthalmic Meds Ordered this visit:  No orders of the defined types were placed in this encounter.      Return in about 6 months (around 12/05/2020) for DILATE OU, OCT.  There are no Patient Instructions on file for this visit.   Explained the diagnoses, plan, and follow up with the patient and they expressed understanding.  Patient expressed understanding of the importance of proper follow up care.   Clent Demark Yanin Muhlestein M.D. Diseases & Surgery of the Retina and Vitreous Retina & Diabetic [place] 06/07/20     Abbreviations: M myopia (nearsighted); A astigmatism; H hyperopia (farsighted); P presbyopia; Mrx spectacle  prescription;  CTL contact lenses; OD right eye; OS left eye; OU both eyes  XT exotropia; ET esotropia; PEK punctate epithelial keratitis; PEE punctate epithelial erosions; DES dry eye syndrome; MGD meibomian gland dysfunction; ATs artificial tears; PFAT's preservative free artificial tears; [place] nuclear sclerotic cataract; PSC posterior subcapsular cataract; ERM epi-retinal membrane; PVD posterior vitreous detachment; RD retinal detachment; DM diabetes mellitus; DR diabetic retinopathy; NPDR non-proliferative diabetic retinopathy; PDR proliferative diabetic retinopathy; CSME clinically significant macular edema; DME diabetic macular edema; dbh dot blot hemorrhages; CWS cotton wool spot; POAG primary open angle glaucoma; C/D cup-to-disc ratio; HVF humphrey visual field; GVF goldmann visual field; OCT optical coherence tomography; IOP intraocular pressure; BRVO Branch retinal vein occlusion; CRVO central retinal vein occlusion; CRAO central retinal artery occlusion; BRAO branch retinal artery occlusion; RT retinal tear; SB scleral buckle; PPV pars plana vitrectomy; VH Vitreous hemorrhage; PRP panretinal laser photocoagulation; IVK intravitreal kenalog; VMT vitreomacular traction; MH Macular hole;  NVD neovascularization of the disc; NVE neovascularization elsewhere; AREDS age related eye disease study; ARMD age related macular degeneration; POAG primary open angle glaucoma; EBMD epithelial/anterior basement membrane dystrophy; ACIOL anterior chamber intraocular lens; IOL intraocular lens; PCIOL posterior chamber intraocular lens; Phaco/IOL phacoemulsification with intraocular lens placement; [place] photorefractive keratectomy; LASIK laser assisted in situ keratomileusis; HTN hypertension; DM diabetes mellitus; COPD chronic obstructive pulmonary disease

## 2020-06-07 NOTE — Assessment & Plan Note (Signed)
No active maculopathy with good blood sugar control, will extend examination interval

## 2020-06-07 NOTE — Assessment & Plan Note (Signed)
Condition OD is remained stable, no active maculopathy

## 2020-07-08 DIAGNOSIS — E1140 Type 2 diabetes mellitus with diabetic neuropathy, unspecified: Secondary | ICD-10-CM | POA: Diagnosis not present

## 2020-07-08 DIAGNOSIS — E785 Hyperlipidemia, unspecified: Secondary | ICD-10-CM | POA: Diagnosis not present

## 2020-07-08 DIAGNOSIS — E119 Type 2 diabetes mellitus without complications: Secondary | ICD-10-CM | POA: Diagnosis not present

## 2020-07-08 DIAGNOSIS — K219 Gastro-esophageal reflux disease without esophagitis: Secondary | ICD-10-CM | POA: Diagnosis not present

## 2020-07-08 DIAGNOSIS — I10 Essential (primary) hypertension: Secondary | ICD-10-CM | POA: Diagnosis not present

## 2020-07-08 DIAGNOSIS — I119 Hypertensive heart disease without heart failure: Secondary | ICD-10-CM | POA: Diagnosis not present

## 2020-07-08 DIAGNOSIS — J45909 Unspecified asthma, uncomplicated: Secondary | ICD-10-CM | POA: Diagnosis not present

## 2020-07-08 DIAGNOSIS — D500 Iron deficiency anemia secondary to blood loss (chronic): Secondary | ICD-10-CM | POA: Diagnosis not present

## 2020-08-14 DIAGNOSIS — Z03818 Encounter for observation for suspected exposure to other biological agents ruled out: Secondary | ICD-10-CM | POA: Diagnosis not present

## 2020-08-25 DIAGNOSIS — M21611 Bunion of right foot: Secondary | ICD-10-CM | POA: Diagnosis not present

## 2020-08-25 DIAGNOSIS — M7989 Other specified soft tissue disorders: Secondary | ICD-10-CM | POA: Diagnosis not present

## 2020-08-30 DIAGNOSIS — E1140 Type 2 diabetes mellitus with diabetic neuropathy, unspecified: Secondary | ICD-10-CM | POA: Diagnosis not present

## 2020-08-30 DIAGNOSIS — E119 Type 2 diabetes mellitus without complications: Secondary | ICD-10-CM | POA: Diagnosis not present

## 2020-08-30 DIAGNOSIS — I119 Hypertensive heart disease without heart failure: Secondary | ICD-10-CM | POA: Diagnosis not present

## 2020-08-30 DIAGNOSIS — J45909 Unspecified asthma, uncomplicated: Secondary | ICD-10-CM | POA: Diagnosis not present

## 2020-08-30 DIAGNOSIS — E785 Hyperlipidemia, unspecified: Secondary | ICD-10-CM | POA: Diagnosis not present

## 2020-08-30 DIAGNOSIS — I10 Essential (primary) hypertension: Secondary | ICD-10-CM | POA: Diagnosis not present

## 2020-08-30 DIAGNOSIS — D500 Iron deficiency anemia secondary to blood loss (chronic): Secondary | ICD-10-CM | POA: Diagnosis not present

## 2020-08-30 DIAGNOSIS — K219 Gastro-esophageal reflux disease without esophagitis: Secondary | ICD-10-CM | POA: Diagnosis not present

## 2020-09-20 DIAGNOSIS — E1140 Type 2 diabetes mellitus with diabetic neuropathy, unspecified: Secondary | ICD-10-CM | POA: Diagnosis not present

## 2020-09-20 DIAGNOSIS — D500 Iron deficiency anemia secondary to blood loss (chronic): Secondary | ICD-10-CM | POA: Diagnosis not present

## 2020-09-20 DIAGNOSIS — J45909 Unspecified asthma, uncomplicated: Secondary | ICD-10-CM | POA: Diagnosis not present

## 2020-09-20 DIAGNOSIS — E785 Hyperlipidemia, unspecified: Secondary | ICD-10-CM | POA: Diagnosis not present

## 2020-09-20 DIAGNOSIS — I119 Hypertensive heart disease without heart failure: Secondary | ICD-10-CM | POA: Diagnosis not present

## 2020-09-20 DIAGNOSIS — I10 Essential (primary) hypertension: Secondary | ICD-10-CM | POA: Diagnosis not present

## 2020-09-20 DIAGNOSIS — K219 Gastro-esophageal reflux disease without esophagitis: Secondary | ICD-10-CM | POA: Diagnosis not present

## 2020-09-20 DIAGNOSIS — E119 Type 2 diabetes mellitus without complications: Secondary | ICD-10-CM | POA: Diagnosis not present

## 2020-09-20 DIAGNOSIS — E875 Hyperkalemia: Secondary | ICD-10-CM | POA: Diagnosis not present

## 2020-10-05 DIAGNOSIS — E119 Type 2 diabetes mellitus without complications: Secondary | ICD-10-CM | POA: Diagnosis not present

## 2020-10-05 DIAGNOSIS — Z01812 Encounter for preprocedural laboratory examination: Secondary | ICD-10-CM | POA: Diagnosis not present

## 2020-10-05 DIAGNOSIS — M21611 Bunion of right foot: Secondary | ICD-10-CM | POA: Diagnosis not present

## 2020-10-05 DIAGNOSIS — M12271 Villonodular synovitis (pigmented), right ankle and foot: Secondary | ICD-10-CM | POA: Diagnosis not present

## 2020-10-05 DIAGNOSIS — Z79899 Other long term (current) drug therapy: Secondary | ICD-10-CM | POA: Diagnosis not present

## 2020-10-06 DIAGNOSIS — I119 Hypertensive heart disease without heart failure: Secondary | ICD-10-CM | POA: Diagnosis not present

## 2020-10-06 DIAGNOSIS — D500 Iron deficiency anemia secondary to blood loss (chronic): Secondary | ICD-10-CM | POA: Diagnosis not present

## 2020-10-06 DIAGNOSIS — E785 Hyperlipidemia, unspecified: Secondary | ICD-10-CM | POA: Diagnosis not present

## 2020-10-06 DIAGNOSIS — E119 Type 2 diabetes mellitus without complications: Secondary | ICD-10-CM | POA: Diagnosis not present

## 2020-10-06 DIAGNOSIS — K219 Gastro-esophageal reflux disease without esophagitis: Secondary | ICD-10-CM | POA: Diagnosis not present

## 2020-10-06 DIAGNOSIS — I10 Essential (primary) hypertension: Secondary | ICD-10-CM | POA: Diagnosis not present

## 2020-10-06 DIAGNOSIS — J45909 Unspecified asthma, uncomplicated: Secondary | ICD-10-CM | POA: Diagnosis not present

## 2020-10-06 DIAGNOSIS — E1140 Type 2 diabetes mellitus with diabetic neuropathy, unspecified: Secondary | ICD-10-CM | POA: Diagnosis not present

## 2020-10-06 DIAGNOSIS — E875 Hyperkalemia: Secondary | ICD-10-CM | POA: Diagnosis not present

## 2020-10-17 DIAGNOSIS — Z124 Encounter for screening for malignant neoplasm of cervix: Secondary | ICD-10-CM | POA: Diagnosis not present

## 2020-10-17 DIAGNOSIS — Z6831 Body mass index (BMI) 31.0-31.9, adult: Secondary | ICD-10-CM | POA: Diagnosis not present

## 2020-10-17 DIAGNOSIS — Z01419 Encounter for gynecological examination (general) (routine) without abnormal findings: Secondary | ICD-10-CM | POA: Diagnosis not present

## 2020-10-17 DIAGNOSIS — N951 Menopausal and female climacteric states: Secondary | ICD-10-CM | POA: Diagnosis not present

## 2020-10-20 DIAGNOSIS — Z30432 Encounter for removal of intrauterine contraceptive device: Secondary | ICD-10-CM | POA: Diagnosis not present

## 2020-11-04 DIAGNOSIS — I10 Essential (primary) hypertension: Secondary | ICD-10-CM | POA: Diagnosis not present

## 2020-11-04 DIAGNOSIS — E118 Type 2 diabetes mellitus with unspecified complications: Secondary | ICD-10-CM | POA: Diagnosis not present

## 2020-11-04 DIAGNOSIS — N189 Chronic kidney disease, unspecified: Secondary | ICD-10-CM | POA: Diagnosis not present

## 2020-11-04 DIAGNOSIS — R809 Proteinuria, unspecified: Secondary | ICD-10-CM | POA: Diagnosis not present

## 2020-11-04 DIAGNOSIS — E785 Hyperlipidemia, unspecified: Secondary | ICD-10-CM | POA: Diagnosis not present

## 2020-11-04 DIAGNOSIS — E875 Hyperkalemia: Secondary | ICD-10-CM | POA: Diagnosis not present

## 2020-11-17 DIAGNOSIS — R809 Proteinuria, unspecified: Secondary | ICD-10-CM | POA: Diagnosis not present

## 2020-12-06 ENCOUNTER — Encounter (INDEPENDENT_AMBULATORY_CARE_PROVIDER_SITE_OTHER): Payer: Medicare HMO | Admitting: Ophthalmology

## 2020-12-09 DIAGNOSIS — N840 Polyp of corpus uteri: Secondary | ICD-10-CM | POA: Diagnosis not present

## 2020-12-09 DIAGNOSIS — N950 Postmenopausal bleeding: Secondary | ICD-10-CM | POA: Diagnosis not present

## 2021-01-09 DIAGNOSIS — E875 Hyperkalemia: Secondary | ICD-10-CM | POA: Diagnosis not present

## 2021-01-09 DIAGNOSIS — I10 Essential (primary) hypertension: Secondary | ICD-10-CM | POA: Diagnosis not present

## 2021-01-09 DIAGNOSIS — R809 Proteinuria, unspecified: Secondary | ICD-10-CM | POA: Diagnosis not present

## 2021-01-09 DIAGNOSIS — E118 Type 2 diabetes mellitus with unspecified complications: Secondary | ICD-10-CM | POA: Diagnosis not present

## 2021-01-10 DIAGNOSIS — E119 Type 2 diabetes mellitus without complications: Secondary | ICD-10-CM | POA: Diagnosis not present

## 2021-01-10 DIAGNOSIS — Z0001 Encounter for general adult medical examination with abnormal findings: Secondary | ICD-10-CM | POA: Diagnosis not present

## 2021-01-10 DIAGNOSIS — I10 Essential (primary) hypertension: Secondary | ICD-10-CM | POA: Diagnosis not present

## 2021-01-10 DIAGNOSIS — E1140 Type 2 diabetes mellitus with diabetic neuropathy, unspecified: Secondary | ICD-10-CM | POA: Diagnosis not present

## 2021-01-10 DIAGNOSIS — J45909 Unspecified asthma, uncomplicated: Secondary | ICD-10-CM | POA: Diagnosis not present

## 2021-01-10 DIAGNOSIS — D500 Iron deficiency anemia secondary to blood loss (chronic): Secondary | ICD-10-CM | POA: Diagnosis not present

## 2021-01-10 DIAGNOSIS — I119 Hypertensive heart disease without heart failure: Secondary | ICD-10-CM | POA: Diagnosis not present

## 2021-01-10 DIAGNOSIS — E785 Hyperlipidemia, unspecified: Secondary | ICD-10-CM | POA: Diagnosis not present

## 2021-01-10 DIAGNOSIS — K219 Gastro-esophageal reflux disease without esophagitis: Secondary | ICD-10-CM | POA: Diagnosis not present

## 2021-01-19 DIAGNOSIS — E785 Hyperlipidemia, unspecified: Secondary | ICD-10-CM | POA: Diagnosis not present

## 2021-01-19 DIAGNOSIS — J45909 Unspecified asthma, uncomplicated: Secondary | ICD-10-CM | POA: Diagnosis not present

## 2021-01-19 DIAGNOSIS — I10 Essential (primary) hypertension: Secondary | ICD-10-CM | POA: Diagnosis not present

## 2021-01-19 DIAGNOSIS — Z0001 Encounter for general adult medical examination with abnormal findings: Secondary | ICD-10-CM | POA: Diagnosis not present

## 2021-01-19 DIAGNOSIS — M542 Cervicalgia: Secondary | ICD-10-CM | POA: Diagnosis not present

## 2021-01-19 DIAGNOSIS — E119 Type 2 diabetes mellitus without complications: Secondary | ICD-10-CM | POA: Diagnosis not present

## 2021-01-19 DIAGNOSIS — E1140 Type 2 diabetes mellitus with diabetic neuropathy, unspecified: Secondary | ICD-10-CM | POA: Diagnosis not present

## 2021-01-19 DIAGNOSIS — D500 Iron deficiency anemia secondary to blood loss (chronic): Secondary | ICD-10-CM | POA: Diagnosis not present

## 2021-01-19 DIAGNOSIS — I119 Hypertensive heart disease without heart failure: Secondary | ICD-10-CM | POA: Diagnosis not present

## 2021-01-23 DIAGNOSIS — Z1231 Encounter for screening mammogram for malignant neoplasm of breast: Secondary | ICD-10-CM | POA: Diagnosis not present

## 2021-02-09 DIAGNOSIS — D500 Iron deficiency anemia secondary to blood loss (chronic): Secondary | ICD-10-CM | POA: Diagnosis not present

## 2021-02-09 DIAGNOSIS — I10 Essential (primary) hypertension: Secondary | ICD-10-CM | POA: Diagnosis not present

## 2021-02-09 DIAGNOSIS — J4530 Mild persistent asthma, uncomplicated: Secondary | ICD-10-CM | POA: Diagnosis not present

## 2021-02-09 DIAGNOSIS — Z794 Long term (current) use of insulin: Secondary | ICD-10-CM | POA: Diagnosis not present

## 2021-02-09 DIAGNOSIS — E782 Mixed hyperlipidemia: Secondary | ICD-10-CM | POA: Diagnosis not present

## 2021-02-09 DIAGNOSIS — I119 Hypertensive heart disease without heart failure: Secondary | ICD-10-CM | POA: Diagnosis not present

## 2021-02-09 DIAGNOSIS — K219 Gastro-esophageal reflux disease without esophagitis: Secondary | ICD-10-CM | POA: Diagnosis not present

## 2021-02-09 DIAGNOSIS — E1165 Type 2 diabetes mellitus with hyperglycemia: Secondary | ICD-10-CM | POA: Diagnosis not present

## 2021-02-22 DIAGNOSIS — Z794 Long term (current) use of insulin: Secondary | ICD-10-CM | POA: Diagnosis not present

## 2021-02-22 DIAGNOSIS — E782 Mixed hyperlipidemia: Secondary | ICD-10-CM | POA: Diagnosis not present

## 2021-02-22 DIAGNOSIS — J4530 Mild persistent asthma, uncomplicated: Secondary | ICD-10-CM | POA: Diagnosis not present

## 2021-02-22 DIAGNOSIS — I10 Essential (primary) hypertension: Secondary | ICD-10-CM | POA: Diagnosis not present

## 2021-02-22 DIAGNOSIS — E1165 Type 2 diabetes mellitus with hyperglycemia: Secondary | ICD-10-CM | POA: Diagnosis not present

## 2021-02-22 DIAGNOSIS — K219 Gastro-esophageal reflux disease without esophagitis: Secondary | ICD-10-CM | POA: Diagnosis not present

## 2021-02-22 DIAGNOSIS — D500 Iron deficiency anemia secondary to blood loss (chronic): Secondary | ICD-10-CM | POA: Diagnosis not present

## 2021-02-22 DIAGNOSIS — I119 Hypertensive heart disease without heart failure: Secondary | ICD-10-CM | POA: Diagnosis not present

## 2021-03-29 DIAGNOSIS — K219 Gastro-esophageal reflux disease without esophagitis: Secondary | ICD-10-CM | POA: Diagnosis not present

## 2021-03-29 DIAGNOSIS — Z1211 Encounter for screening for malignant neoplasm of colon: Secondary | ICD-10-CM | POA: Diagnosis not present

## 2021-03-29 DIAGNOSIS — E1165 Type 2 diabetes mellitus with hyperglycemia: Secondary | ICD-10-CM | POA: Diagnosis not present

## 2021-03-29 DIAGNOSIS — J45998 Other asthma: Secondary | ICD-10-CM | POA: Diagnosis not present

## 2021-03-29 DIAGNOSIS — I10 Essential (primary) hypertension: Secondary | ICD-10-CM | POA: Diagnosis not present

## 2021-03-29 DIAGNOSIS — Z800 Family history of malignant neoplasm of digestive organs: Secondary | ICD-10-CM | POA: Diagnosis not present

## 2021-04-18 DIAGNOSIS — I10 Essential (primary) hypertension: Secondary | ICD-10-CM | POA: Diagnosis not present

## 2021-04-18 DIAGNOSIS — E1165 Type 2 diabetes mellitus with hyperglycemia: Secondary | ICD-10-CM | POA: Diagnosis not present

## 2021-04-18 DIAGNOSIS — K219 Gastro-esophageal reflux disease without esophagitis: Secondary | ICD-10-CM | POA: Diagnosis not present

## 2021-04-18 DIAGNOSIS — J4530 Mild persistent asthma, uncomplicated: Secondary | ICD-10-CM | POA: Diagnosis not present

## 2021-04-18 DIAGNOSIS — E782 Mixed hyperlipidemia: Secondary | ICD-10-CM | POA: Diagnosis not present

## 2021-04-18 DIAGNOSIS — Z794 Long term (current) use of insulin: Secondary | ICD-10-CM | POA: Diagnosis not present

## 2021-04-18 DIAGNOSIS — I119 Hypertensive heart disease without heart failure: Secondary | ICD-10-CM | POA: Diagnosis not present

## 2021-04-18 DIAGNOSIS — D500 Iron deficiency anemia secondary to blood loss (chronic): Secondary | ICD-10-CM | POA: Diagnosis not present

## 2021-05-11 DIAGNOSIS — U071 COVID-19: Secondary | ICD-10-CM | POA: Diagnosis not present

## 2021-05-11 DIAGNOSIS — E1165 Type 2 diabetes mellitus with hyperglycemia: Secondary | ICD-10-CM | POA: Diagnosis not present

## 2021-05-12 DIAGNOSIS — Z20822 Contact with and (suspected) exposure to covid-19: Secondary | ICD-10-CM | POA: Diagnosis not present

## 2021-06-12 DIAGNOSIS — E1165 Type 2 diabetes mellitus with hyperglycemia: Secondary | ICD-10-CM | POA: Diagnosis not present

## 2021-06-12 DIAGNOSIS — I119 Hypertensive heart disease without heart failure: Secondary | ICD-10-CM | POA: Diagnosis not present

## 2021-06-12 DIAGNOSIS — Z794 Long term (current) use of insulin: Secondary | ICD-10-CM | POA: Diagnosis not present

## 2021-06-12 DIAGNOSIS — E782 Mixed hyperlipidemia: Secondary | ICD-10-CM | POA: Diagnosis not present

## 2021-06-12 DIAGNOSIS — I10 Essential (primary) hypertension: Secondary | ICD-10-CM | POA: Diagnosis not present

## 2021-06-12 DIAGNOSIS — J4530 Mild persistent asthma, uncomplicated: Secondary | ICD-10-CM | POA: Diagnosis not present

## 2021-06-12 DIAGNOSIS — K219 Gastro-esophageal reflux disease without esophagitis: Secondary | ICD-10-CM | POA: Diagnosis not present

## 2021-06-12 DIAGNOSIS — D500 Iron deficiency anemia secondary to blood loss (chronic): Secondary | ICD-10-CM | POA: Diagnosis not present

## 2021-06-19 DIAGNOSIS — R252 Cramp and spasm: Secondary | ICD-10-CM | POA: Diagnosis not present

## 2021-06-19 DIAGNOSIS — E875 Hyperkalemia: Secondary | ICD-10-CM | POA: Diagnosis not present

## 2021-06-19 DIAGNOSIS — I10 Essential (primary) hypertension: Secondary | ICD-10-CM | POA: Diagnosis not present

## 2021-06-20 DIAGNOSIS — Z1210 Encounter for screening for malignant neoplasm of intestinal tract, unspecified: Secondary | ICD-10-CM | POA: Diagnosis not present

## 2021-06-20 DIAGNOSIS — D123 Benign neoplasm of transverse colon: Secondary | ICD-10-CM | POA: Diagnosis not present

## 2021-06-20 DIAGNOSIS — K635 Polyp of colon: Secondary | ICD-10-CM | POA: Diagnosis not present

## 2021-06-20 DIAGNOSIS — D122 Benign neoplasm of ascending colon: Secondary | ICD-10-CM | POA: Diagnosis not present

## 2021-06-20 DIAGNOSIS — Z1211 Encounter for screening for malignant neoplasm of colon: Secondary | ICD-10-CM | POA: Diagnosis not present

## 2021-06-20 DIAGNOSIS — K5730 Diverticulosis of large intestine without perforation or abscess without bleeding: Secondary | ICD-10-CM | POA: Diagnosis not present

## 2021-07-04 ENCOUNTER — Other Ambulatory Visit: Payer: Self-pay

## 2021-07-04 ENCOUNTER — Ambulatory Visit (INDEPENDENT_AMBULATORY_CARE_PROVIDER_SITE_OTHER): Payer: Medicare HMO | Admitting: Ophthalmology

## 2021-07-04 ENCOUNTER — Encounter (INDEPENDENT_AMBULATORY_CARE_PROVIDER_SITE_OTHER): Payer: Self-pay | Admitting: Ophthalmology

## 2021-07-04 DIAGNOSIS — H43823 Vitreomacular adhesion, bilateral: Secondary | ICD-10-CM

## 2021-07-04 DIAGNOSIS — E113412 Type 2 diabetes mellitus with severe nonproliferative diabetic retinopathy with macular edema, left eye: Secondary | ICD-10-CM

## 2021-07-04 DIAGNOSIS — E113491 Type 2 diabetes mellitus with severe nonproliferative diabetic retinopathy without macular edema, right eye: Secondary | ICD-10-CM

## 2021-07-04 DIAGNOSIS — E113492 Type 2 diabetes mellitus with severe nonproliferative diabetic retinopathy without macular edema, left eye: Secondary | ICD-10-CM

## 2021-07-04 DIAGNOSIS — E113493 Type 2 diabetes mellitus with severe nonproliferative diabetic retinopathy without macular edema, bilateral: Secondary | ICD-10-CM

## 2021-07-04 NOTE — Assessment & Plan Note (Signed)
Physiologic OU observe 

## 2021-07-04 NOTE — Assessment & Plan Note (Signed)
Local progression of NPDR follow-up in 9 months

## 2021-07-04 NOTE — Assessment & Plan Note (Signed)
No progression in severe NPDR follow-up in 9 months

## 2021-07-04 NOTE — Progress Notes (Signed)
07/04/2021     CHIEF COMPLAINT Patient presents for  Chief Complaint  Patient presents with   Retina Follow Up      HISTORY OF PRESENT ILLNESS: Leslie Scott is a 55 y.o. female who presents to the clinic today for:   HPI     Retina Follow Up   Patient presents with  Diabetic Retinopathy.  In both eyes.  This started 1 year ago.  Severity is mild.  Duration of 1 year.  Since onset it is stable.        Comments   1 year fu ou and oct Pt states, " My vision seems to be about the same. The only issue that I am having is that at night I am having issue with glare." A1C: 8.1 LBS: 200       Last edited by Kendra Opitz, COA on 07/04/2021  9:15 AM.      Referring physician: Benito Mccreedy, MD [address] [place],  [place] [address]  HISTORICAL INFORMATION:   Selected notes from the [place]: No current outpatient medications on file. (Ophthalmic Drugs)   No current facility-administered medications for this visit. (Ophthalmic Drugs)   Current Outpatient Medications (Other)  Medication Sig   diphenhydrAMINE (BENADRYL) 25 MG tablet Take 1 tablet (25 mg total) by mouth every 6 (six) hours.   Dulaglutide (TRULICITY) 1.5 DD/2.2GU SOPN Inject into the skin once a week.   famotidine (PEPCID) 20 MG tablet Take 1 tablet (20 mg total) by mouth 2 (two) times daily.   insulin glargine (LANTUS) 100 UNIT/ML injection Inject 26 Units into the skin 2 (two) times daily.    loratadine (CLARITIN) 10 MG tablet Take 10 mg by mouth daily as needed.    losartan (COZAAR) 25 MG tablet Take 25 mg by mouth every morning.    metoprolol succinate (TOPROL-XL) 50 MG 24 hr tablet Take 50 mg by mouth every morning.    mometasone-formoterol (DULERA) 100-5 MCG/ACT AERO Take 2 puffs first thing in am and then another 2 puffs about 12 hours later. (Patient taking differently: Inhale 1 puff into the lungs every morning. Take 2 puffs first thing  in am and then another 2 puffs about 12 hours later.)   omeprazole (PRILOSEC) 40 MG capsule Take 40 mg by mouth daily.   sitaGLIPtin-metformin (JANUMET) 50-1000 MG per tablet Take 1 tablet by mouth 2 (two) times daily.    VENTOLIN HFA 108 (90 Base) MCG/ACT inhaler INHALE 2 PUFFS INTO THE LUNGS EVERY 6 (SIX) HOURS AS NEEDED FOR WHEEZING OR SHORTNESS OF BREATH.   No current facility-administered medications for this visit. (Other)      REVIEW OF SYSTEMS:    ALLERGIES Allergies  Allergen Reactions   Morphine And Related Shortness Of Breath   Shellfish Allergy Anaphylaxis    ALL SEAFOOD    PAST MEDICAL HISTORY Past Medical History:  Diagnosis Date   Adenotonsillar hypertrophy    Arthritis    shoulders   GERD (gastroesophageal reflux disease)    Hypertension    Mild persistent asthma    pulmologist-  dr wert   OSA (obstructive sleep apnea)    per pt no cpap   Palpitations    SUI (stress urinary incontinence, female)    Type 2 diabetes mellitus treated with insulin ([place])    followed by pcp   Past Surgical History:  Procedure Laterality Date   ABDOMINOPLASTY  2017  w/ Lipo   BREAST ENHANCEMENT SURGERY Bilateral 2001   and Abdominoplasty   CATARACT EXTRACTION W/ INTRAOCULAR LENS  IMPLANT, BILATERAL  2013   CYSTOSCOPY N/A 01/27/2018   Procedure: CYSTOSCOPY;  Surgeon: Franchot Gallo, MD;  Location: [facility];  Service: Urology;  Laterality: N/A;   PUBOVAGINAL SLING N/A 01/27/2018   Procedure: Gaynelle Arabian, Janyth Pupa;  Surgeon: Franchot Gallo, MD;  Location: [facility];  Service: Urology;  Laterality: N/A;   TONSILLECTOMY AND ADENOIDECTOMY Bilateral 03/03/2019   Procedure: TONSILLECTOMY AND ADENOIDECTOMY;  Surgeon: Leta Baptist, MD;  Location: [facility];  Service: ENT;  Laterality: Bilateral;    FAMILY HISTORY Family History  Problem Relation Age of Onset   [place] cancer Maternal Aunt    Allergies Mother     Asthma Mother     SOCIAL HISTORY Social History   Tobacco Use   Smoking status: Former    Packs/day: 0.50    Years: 20.00    Pack years: 10.00    Types: Cigarettes    Quit date: 08/06/2005    Years since quitting: 15.9   Smokeless tobacco: Never  Vaping Use   Vaping Use: Never used  Substance Use Topics   Alcohol use: Yes    Alcohol/week: 0.0 standard drinks    Comment: OCCASIONAL   Drug use: No         OPHTHALMIC EXAM:  Base Eye Exam     Visual Acuity (ETDRS)       Right Left   Dist [employer] 20/20 -1 20/20 -1         Tonometry (Tonopen, 9:19 AM)       Right Left   Pressure 13 13         Pupils       Pupils Dark Light Shape React APD   Right PERRL 4 3 Round Brisk None   Left PERRL 3 2 Round Brisk None         Visual Fields (Counting fingers)       Left Right    Full Full         Extraocular Movement       Right Left    Full, Ortho Full, Ortho         Neuro/Psych     Oriented x3: Yes   Mood/Affect: Normal         Dilation     Both eyes: 1.0% Mydriacyl, 2.5% Phenylephrine @ 9:19 AM           Slit Lamp and Fundus Exam     External Exam       Right Left   External Normal Normal         Slit Lamp Exam       Right Left   Lids/Lashes Normal Normal   Conjunctiva/Sclera White and quiet White and quiet   Cornea Clear Clear   Anterior Chamber Deep and quiet Deep and quiet   Iris Round and reactive Round and reactive   Lens Posterior chamber intraocular lens Posterior chamber intraocular lens   Anterior Vitreous Normal Normal         Fundus Exam       Right Left   Posterior Vitreous Normal Normal   Disc Normal Normal   C/D Ratio 0.25 0.3   Macula Microaneurysms, no clinically significant macular edema no exudates, no clinically significant macular edema, Microaneurysms   Vessels NPDR-Severe, stable NPDR-Severe, stable   Periphery Normal Normal  IMAGING AND PROCEDURES  Imaging and Procedures for  07/04/21  OCT, Retina - OU - Both Eyes       Right Eye Quality was good. Scan locations included subfoveal. Central Foveal Thickness: 263. Progression has been stable. Findings include normal foveal contour, vitreomacular adhesion .   Left Eye Quality was good. Scan locations included subfoveal. Central Foveal Thickness: 259. Progression has been stable. Findings include normal foveal contour, vitreomacular adhesion .   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 Severe nonproliferative diabetic retinopathy of left eye, with macular edema, associated with type 2 diabetes mellitus (HCC)  W11.9147 OCT, Retina - OU - Both Eyes    2. 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 Severe nonproliferative diabetic retinopathy of left eye associated with type 2 diabetes mellitus, macular edema presence unspecified ([place])  W29.5621     4. Vitreomacular adhesion of both eyes  H43.823       1.  No progression of severe NPDR OU.  Will need follow-up in 9 months and likely need perfusion analysis.  2.  3.  Ophthalmic Meds Ordered this visit:  No orders of the defined types were placed in this encounter.      Return in about 9 months (around 04/03/2022) for DILATE OU, COLOR FP, OCT, OPTOS FFA L/R.  There are no Patient Instructions on file for this visit.   Explained the diagnoses, plan, and follow up with the patient and they expressed  understanding.  Patient expressed understanding of the importance of proper follow up care.   Clent Demark Gamal Todisco M.D. Diseases & Surgery of the Retina and Vitreous Retina & Diabetic [place] 07/04/21     Abbreviations: M myopia (nearsighted); A astigmatism; H hyperopia (farsighted); P presbyopia; Mrx spectacle prescription;  CTL contact lenses; OD right eye; OS left eye; OU both eyes  XT exotropia; ET esotropia; PEK punctate epithelial keratitis; PEE punctate epithelial erosions; DES dry eye syndrome; MGD meibomian gland dysfunction; ATs artificial tears; PFAT's preservative free artificial tears; [place] nuclear sclerotic cataract; PSC posterior subcapsular cataract; ERM epi-retinal membrane; PVD posterior vitreous detachment; RD retinal detachment; DM diabetes mellitus; DR diabetic retinopathy; NPDR non-proliferative diabetic retinopathy; PDR proliferative diabetic retinopathy; CSME clinically significant macular edema; DME diabetic macular edema; dbh dot blot hemorrhages; CWS cotton wool spot; POAG primary open angle glaucoma; C/D cup-to-disc ratio; HVF humphrey visual field; GVF goldmann visual field; OCT optical coherence tomography; IOP intraocular pressure; BRVO Branch retinal vein occlusion; CRVO central retinal vein occlusion; CRAO central retinal artery occlusion; BRAO branch retinal artery occlusion; RT retinal tear; SB scleral buckle; PPV pars plana vitrectomy; VH Vitreous hemorrhage; PRP panretinal laser photocoagulation; IVK intravitreal kenalog; VMT vitreomacular traction; MH Macular hole;  NVD neovascularization of the disc; NVE neovascularization elsewhere; AREDS age related eye disease study; ARMD age related macular degeneration; POAG primary open angle glaucoma; EBMD epithelial/anterior basement membrane dystrophy; ACIOL anterior chamber intraocular lens; IOL intraocular lens; PCIOL posterior  chamber intraocular lens; Phaco/IOL phacoemulsification with intraocular lens placement; [place]  photorefractive keratectomy; LASIK laser assisted in situ keratomileusis; HTN hypertension; DM diabetes mellitus; COPD chronic obstructive pulmonary disease

## 2021-08-09 DIAGNOSIS — J4530 Mild persistent asthma, uncomplicated: Secondary | ICD-10-CM | POA: Diagnosis not present

## 2021-08-09 DIAGNOSIS — I10 Essential (primary) hypertension: Secondary | ICD-10-CM | POA: Diagnosis not present

## 2021-08-09 DIAGNOSIS — D500 Iron deficiency anemia secondary to blood loss (chronic): Secondary | ICD-10-CM | POA: Diagnosis not present

## 2021-08-09 DIAGNOSIS — M25521 Pain in right elbow: Secondary | ICD-10-CM | POA: Diagnosis not present

## 2021-08-09 DIAGNOSIS — M25562 Pain in left knee: Secondary | ICD-10-CM | POA: Diagnosis not present

## 2021-08-09 DIAGNOSIS — I119 Hypertensive heart disease without heart failure: Secondary | ICD-10-CM | POA: Diagnosis not present

## 2021-08-09 DIAGNOSIS — E1165 Type 2 diabetes mellitus with hyperglycemia: Secondary | ICD-10-CM | POA: Diagnosis not present

## 2021-08-09 DIAGNOSIS — Z794 Long term (current) use of insulin: Secondary | ICD-10-CM | POA: Diagnosis not present

## 2021-08-09 DIAGNOSIS — E782 Mixed hyperlipidemia: Secondary | ICD-10-CM | POA: Diagnosis not present

## 2021-08-18 DIAGNOSIS — M7711 Lateral epicondylitis, right elbow: Secondary | ICD-10-CM | POA: Diagnosis not present

## 2021-08-18 DIAGNOSIS — M25562 Pain in left knee: Secondary | ICD-10-CM | POA: Diagnosis not present

## 2021-08-24 ENCOUNTER — Other Ambulatory Visit: Payer: Self-pay | Admitting: Internal Medicine

## 2021-08-24 DIAGNOSIS — Z780 Asymptomatic menopausal state: Secondary | ICD-10-CM

## 2021-09-11 DIAGNOSIS — E782 Mixed hyperlipidemia: Secondary | ICD-10-CM | POA: Diagnosis not present

## 2021-09-11 DIAGNOSIS — J4530 Mild persistent asthma, uncomplicated: Secondary | ICD-10-CM | POA: Diagnosis not present

## 2021-09-11 DIAGNOSIS — J45909 Unspecified asthma, uncomplicated: Secondary | ICD-10-CM | POA: Diagnosis not present

## 2021-09-11 DIAGNOSIS — I10 Essential (primary) hypertension: Secondary | ICD-10-CM | POA: Diagnosis not present

## 2021-09-11 DIAGNOSIS — E1165 Type 2 diabetes mellitus with hyperglycemia: Secondary | ICD-10-CM | POA: Diagnosis not present

## 2021-09-11 DIAGNOSIS — K219 Gastro-esophageal reflux disease without esophagitis: Secondary | ICD-10-CM | POA: Diagnosis not present

## 2021-09-11 DIAGNOSIS — Z794 Long term (current) use of insulin: Secondary | ICD-10-CM | POA: Diagnosis not present

## 2021-09-12 ENCOUNTER — Other Ambulatory Visit: Payer: Self-pay

## 2021-09-12 ENCOUNTER — Ambulatory Visit
Admission: RE | Admit: 2021-09-12 | Discharge: 2021-09-12 | Disposition: A | Discharge status: Home / Self Care | Payer: Medicare HMO | Source: Ambulatory Visit | Attending: Internal Medicine | Admitting: Internal Medicine

## 2021-09-12 DIAGNOSIS — Z780 Asymptomatic menopausal state: Secondary | ICD-10-CM | POA: Diagnosis not present

## 2021-09-12 DIAGNOSIS — M85832 Other specified disorders of bone density and structure, left forearm: Secondary | ICD-10-CM | POA: Diagnosis not present

## 2021-09-20 DIAGNOSIS — M25562 Pain in left knee: Secondary | ICD-10-CM | POA: Diagnosis not present

## 2021-09-20 DIAGNOSIS — M7711 Lateral epicondylitis, right elbow: Secondary | ICD-10-CM | POA: Diagnosis not present

## 2021-10-11 ENCOUNTER — Encounter: Payer: Self-pay | Admitting: Podiatry

## 2021-10-11 ENCOUNTER — Ambulatory Visit (INDEPENDENT_AMBULATORY_CARE_PROVIDER_SITE_OTHER): Payer: Medicare HMO | Admitting: Podiatry

## 2021-10-11 ENCOUNTER — Other Ambulatory Visit: Payer: Self-pay

## 2021-10-11 ENCOUNTER — Ambulatory Visit (INDEPENDENT_AMBULATORY_CARE_PROVIDER_SITE_OTHER): Payer: Medicare HMO

## 2021-10-11 DIAGNOSIS — M21612 Bunion of left foot: Secondary | ICD-10-CM

## 2021-10-11 DIAGNOSIS — M21611 Bunion of right foot: Secondary | ICD-10-CM | POA: Diagnosis not present

## 2021-10-11 DIAGNOSIS — M21619 Bunion of unspecified foot: Secondary | ICD-10-CM

## 2021-10-11 NOTE — Patient Instructions (Signed)

## 2021-10-11 NOTE — Progress Notes (Signed)
Subjective:  ? ?Patient ID: Leslie Scott, female   DOB: 56 y.o.   MRN: [id number]  ? ?HPI ?Patient presents stating she does have a bunion deformity right over left and states its been very sore and she at 1 point would like to get it corrected.  She is working on her sugar and states her last A1c was 8.8 and she is working to bring it down at this time ? ? ?ROS ? ? ?   ?Objective:  ?Physical Exam  ?Neurovascular status intact with patient found to have hyperostosis with redness around the first metatarsal head right over left with deviation of a mild nature big toe.  Patient has good digital perfusion well oriented x3 with pain on the direct pressure to the first metatarsal ? ?   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 ?   ?Plan:  ?H&P reviewed condition and discussed that she would do well with distal osteotomy but I would like her to see her A1c come down from where it is currently.  Patient will continue to work on this is encouraged to call with questions concerns which may arise and will be scheduled back in 3 months and hopefully if the A1c is down we can get her scheduled to have this corrected ? ?X-rays indicate there is elevation of the intermetatarsal angle bilaterally with no other consistent pathology but hyperostosis around the first metatarsal head right over left ?   ? ? ?

## 2021-10-23 DIAGNOSIS — Z6831 Body mass index (BMI) 31.0-31.9, adult: Secondary | ICD-10-CM | POA: Diagnosis not present

## 2021-10-23 DIAGNOSIS — Z01419 Encounter for gynecological examination (general) (routine) without abnormal findings: Secondary | ICD-10-CM | POA: Diagnosis not present

## 2021-11-09 DIAGNOSIS — Z6831 Body mass index (BMI) 31.0-31.9, adult: Secondary | ICD-10-CM | POA: Diagnosis not present

## 2021-11-09 DIAGNOSIS — M542 Cervicalgia: Secondary | ICD-10-CM | POA: Diagnosis not present

## 2021-11-09 DIAGNOSIS — M25511 Pain in right shoulder: Secondary | ICD-10-CM | POA: Diagnosis not present

## 2021-11-09 DIAGNOSIS — Z79899 Other long term (current) drug therapy: Secondary | ICD-10-CM | POA: Diagnosis not present

## 2021-11-09 DIAGNOSIS — Z0130 Encounter for examination of blood pressure without abnormal findings: Secondary | ICD-10-CM | POA: Diagnosis not present

## 2021-11-17 DIAGNOSIS — M542 Cervicalgia: Secondary | ICD-10-CM | POA: Diagnosis not present

## 2021-11-17 DIAGNOSIS — Z0130 Encounter for examination of blood pressure without abnormal findings: Secondary | ICD-10-CM | POA: Diagnosis not present

## 2021-11-17 DIAGNOSIS — Z6831 Body mass index (BMI) 31.0-31.9, adult: Secondary | ICD-10-CM | POA: Diagnosis not present

## 2021-11-17 DIAGNOSIS — M25511 Pain in right shoulder: Secondary | ICD-10-CM | POA: Diagnosis not present

## 2021-11-21 DIAGNOSIS — Z79899 Other long term (current) drug therapy: Secondary | ICD-10-CM | POA: Diagnosis not present

## 2021-11-21 DIAGNOSIS — R030 Elevated blood-pressure reading, without diagnosis of hypertension: Secondary | ICD-10-CM | POA: Diagnosis not present

## 2021-11-21 DIAGNOSIS — Z6830 Body mass index (BMI) 30.0-30.9, adult: Secondary | ICD-10-CM | POA: Diagnosis not present

## 2021-11-21 DIAGNOSIS — M542 Cervicalgia: Secondary | ICD-10-CM | POA: Diagnosis not present

## 2021-11-28 DIAGNOSIS — R293 Abnormal posture: Secondary | ICD-10-CM | POA: Diagnosis not present

## 2021-11-28 DIAGNOSIS — M542 Cervicalgia: Secondary | ICD-10-CM | POA: Diagnosis not present

## 2021-11-28 DIAGNOSIS — M6281 Muscle weakness (generalized): Secondary | ICD-10-CM | POA: Diagnosis not present

## 2021-11-28 DIAGNOSIS — M2569 Stiffness of other specified joint, not elsewhere classified: Secondary | ICD-10-CM | POA: Diagnosis not present

## 2021-12-05 DIAGNOSIS — R293 Abnormal posture: Secondary | ICD-10-CM | POA: Diagnosis not present

## 2021-12-05 DIAGNOSIS — M2569 Stiffness of other specified joint, not elsewhere classified: Secondary | ICD-10-CM | POA: Diagnosis not present

## 2021-12-05 DIAGNOSIS — M542 Cervicalgia: Secondary | ICD-10-CM | POA: Diagnosis not present

## 2021-12-05 DIAGNOSIS — M6281 Muscle weakness (generalized): Secondary | ICD-10-CM | POA: Diagnosis not present

## 2021-12-08 DIAGNOSIS — I10 Essential (primary) hypertension: Secondary | ICD-10-CM | POA: Diagnosis not present

## 2021-12-08 DIAGNOSIS — J302 Other seasonal allergic rhinitis: Secondary | ICD-10-CM | POA: Diagnosis not present

## 2021-12-08 DIAGNOSIS — K219 Gastro-esophageal reflux disease without esophagitis: Secondary | ICD-10-CM | POA: Diagnosis not present

## 2021-12-08 DIAGNOSIS — J45909 Unspecified asthma, uncomplicated: Secondary | ICD-10-CM | POA: Diagnosis not present

## 2021-12-08 DIAGNOSIS — Z0001 Encounter for general adult medical examination with abnormal findings: Secondary | ICD-10-CM | POA: Diagnosis not present

## 2021-12-08 DIAGNOSIS — E1165 Type 2 diabetes mellitus with hyperglycemia: Secondary | ICD-10-CM | POA: Diagnosis not present

## 2021-12-08 DIAGNOSIS — E782 Mixed hyperlipidemia: Secondary | ICD-10-CM | POA: Diagnosis not present

## 2021-12-11 DIAGNOSIS — M2569 Stiffness of other specified joint, not elsewhere classified: Secondary | ICD-10-CM | POA: Diagnosis not present

## 2021-12-11 DIAGNOSIS — R293 Abnormal posture: Secondary | ICD-10-CM | POA: Diagnosis not present

## 2021-12-11 DIAGNOSIS — M542 Cervicalgia: Secondary | ICD-10-CM | POA: Diagnosis not present

## 2021-12-11 DIAGNOSIS — M6281 Muscle weakness (generalized): Secondary | ICD-10-CM | POA: Diagnosis not present

## 2021-12-21 DIAGNOSIS — R293 Abnormal posture: Secondary | ICD-10-CM | POA: Diagnosis not present

## 2021-12-21 DIAGNOSIS — Z0130 Encounter for examination of blood pressure without abnormal findings: Secondary | ICD-10-CM | POA: Diagnosis not present

## 2021-12-21 DIAGNOSIS — M542 Cervicalgia: Secondary | ICD-10-CM | POA: Diagnosis not present

## 2021-12-21 DIAGNOSIS — E785 Hyperlipidemia, unspecified: Secondary | ICD-10-CM | POA: Diagnosis not present

## 2021-12-21 DIAGNOSIS — M545 Low back pain, unspecified: Secondary | ICD-10-CM | POA: Diagnosis not present

## 2021-12-21 DIAGNOSIS — M6281 Muscle weakness (generalized): Secondary | ICD-10-CM | POA: Diagnosis not present

## 2021-12-21 DIAGNOSIS — Z6830 Body mass index (BMI) 30.0-30.9, adult: Secondary | ICD-10-CM | POA: Diagnosis not present

## 2021-12-21 DIAGNOSIS — Z79899 Other long term (current) drug therapy: Secondary | ICD-10-CM | POA: Diagnosis not present

## 2021-12-21 DIAGNOSIS — M2569 Stiffness of other specified joint, not elsewhere classified: Secondary | ICD-10-CM | POA: Diagnosis not present

## 2021-12-21 DIAGNOSIS — E1165 Type 2 diabetes mellitus with hyperglycemia: Secondary | ICD-10-CM | POA: Diagnosis not present

## 2022-01-03 ENCOUNTER — Ambulatory Visit: Payer: Medicare HMO | Admitting: Podiatry

## 2022-01-10 DIAGNOSIS — E1165 Type 2 diabetes mellitus with hyperglycemia: Secondary | ICD-10-CM | POA: Diagnosis not present

## 2022-01-10 DIAGNOSIS — I10 Essential (primary) hypertension: Secondary | ICD-10-CM | POA: Diagnosis not present

## 2022-01-10 DIAGNOSIS — K219 Gastro-esophageal reflux disease without esophagitis: Secondary | ICD-10-CM | POA: Diagnosis not present

## 2022-01-10 DIAGNOSIS — J45909 Unspecified asthma, uncomplicated: Secondary | ICD-10-CM | POA: Diagnosis not present

## 2022-01-10 DIAGNOSIS — J302 Other seasonal allergic rhinitis: Secondary | ICD-10-CM | POA: Diagnosis not present

## 2022-01-10 DIAGNOSIS — Z0000 Encounter for general adult medical examination without abnormal findings: Secondary | ICD-10-CM | POA: Diagnosis not present

## 2022-01-10 DIAGNOSIS — E782 Mixed hyperlipidemia: Secondary | ICD-10-CM | POA: Diagnosis not present

## 2022-01-11 DIAGNOSIS — M2569 Stiffness of other specified joint, not elsewhere classified: Secondary | ICD-10-CM | POA: Diagnosis not present

## 2022-01-11 DIAGNOSIS — M6281 Muscle weakness (generalized): Secondary | ICD-10-CM | POA: Diagnosis not present

## 2022-01-11 DIAGNOSIS — M542 Cervicalgia: Secondary | ICD-10-CM | POA: Diagnosis not present

## 2022-01-11 DIAGNOSIS — R293 Abnormal posture: Secondary | ICD-10-CM | POA: Diagnosis not present

## 2022-01-19 DIAGNOSIS — M542 Cervicalgia: Secondary | ICD-10-CM | POA: Diagnosis not present

## 2022-01-19 DIAGNOSIS — R293 Abnormal posture: Secondary | ICD-10-CM | POA: Diagnosis not present

## 2022-01-19 DIAGNOSIS — M6281 Muscle weakness (generalized): Secondary | ICD-10-CM | POA: Diagnosis not present

## 2022-01-19 DIAGNOSIS — M2569 Stiffness of other specified joint, not elsewhere classified: Secondary | ICD-10-CM | POA: Diagnosis not present

## 2022-01-29 DIAGNOSIS — Z1231 Encounter for screening mammogram for malignant neoplasm of breast: Secondary | ICD-10-CM | POA: Diagnosis not present

## 2022-02-02 DIAGNOSIS — M6281 Muscle weakness (generalized): Secondary | ICD-10-CM | POA: Diagnosis not present

## 2022-02-02 DIAGNOSIS — M542 Cervicalgia: Secondary | ICD-10-CM | POA: Diagnosis not present

## 2022-02-02 DIAGNOSIS — R293 Abnormal posture: Secondary | ICD-10-CM | POA: Diagnosis not present

## 2022-02-02 DIAGNOSIS — M2569 Stiffness of other specified joint, not elsewhere classified: Secondary | ICD-10-CM | POA: Diagnosis not present

## 2022-02-13 DIAGNOSIS — R928 Other abnormal and inconclusive findings on diagnostic imaging of breast: Secondary | ICD-10-CM | POA: Diagnosis not present

## 2022-02-19 DIAGNOSIS — N950 Postmenopausal bleeding: Secondary | ICD-10-CM | POA: Diagnosis not present

## 2022-02-19 DIAGNOSIS — N858 Other specified noninflammatory disorders of uterus: Secondary | ICD-10-CM | POA: Diagnosis not present

## 2022-02-20 DIAGNOSIS — M542 Cervicalgia: Secondary | ICD-10-CM | POA: Diagnosis not present

## 2022-02-20 DIAGNOSIS — M6281 Muscle weakness (generalized): Secondary | ICD-10-CM | POA: Diagnosis not present

## 2022-02-20 DIAGNOSIS — M2569 Stiffness of other specified joint, not elsewhere classified: Secondary | ICD-10-CM | POA: Diagnosis not present

## 2022-02-20 DIAGNOSIS — R293 Abnormal posture: Secondary | ICD-10-CM | POA: Diagnosis not present

## 2022-03-05 ENCOUNTER — Encounter (INDEPENDENT_AMBULATORY_CARE_PROVIDER_SITE_OTHER): Payer: Medicare HMO | Admitting: Ophthalmology

## 2022-03-20 DIAGNOSIS — E782 Mixed hyperlipidemia: Secondary | ICD-10-CM | POA: Diagnosis not present

## 2022-03-20 DIAGNOSIS — J302 Other seasonal allergic rhinitis: Secondary | ICD-10-CM | POA: Diagnosis not present

## 2022-03-20 DIAGNOSIS — J45909 Unspecified asthma, uncomplicated: Secondary | ICD-10-CM | POA: Diagnosis not present

## 2022-03-20 DIAGNOSIS — Z0000 Encounter for general adult medical examination without abnormal findings: Secondary | ICD-10-CM | POA: Diagnosis not present

## 2022-03-20 DIAGNOSIS — E1165 Type 2 diabetes mellitus with hyperglycemia: Secondary | ICD-10-CM | POA: Diagnosis not present

## 2022-03-20 DIAGNOSIS — I10 Essential (primary) hypertension: Secondary | ICD-10-CM | POA: Diagnosis not present

## 2022-03-20 DIAGNOSIS — K219 Gastro-esophageal reflux disease without esophagitis: Secondary | ICD-10-CM | POA: Diagnosis not present

## 2022-03-23 DIAGNOSIS — K219 Gastro-esophageal reflux disease without esophagitis: Secondary | ICD-10-CM | POA: Diagnosis not present

## 2022-03-23 DIAGNOSIS — J45909 Unspecified asthma, uncomplicated: Secondary | ICD-10-CM | POA: Diagnosis not present

## 2022-03-23 DIAGNOSIS — E1165 Type 2 diabetes mellitus with hyperglycemia: Secondary | ICD-10-CM | POA: Diagnosis not present

## 2022-03-23 DIAGNOSIS — E782 Mixed hyperlipidemia: Secondary | ICD-10-CM | POA: Diagnosis not present

## 2022-03-23 DIAGNOSIS — I10 Essential (primary) hypertension: Secondary | ICD-10-CM | POA: Diagnosis not present

## 2022-03-23 DIAGNOSIS — J302 Other seasonal allergic rhinitis: Secondary | ICD-10-CM | POA: Diagnosis not present

## 2022-04-03 ENCOUNTER — Ambulatory Visit (INDEPENDENT_AMBULATORY_CARE_PROVIDER_SITE_OTHER): Payer: Medicare HMO | Admitting: Ophthalmology

## 2022-04-03 ENCOUNTER — Encounter (INDEPENDENT_AMBULATORY_CARE_PROVIDER_SITE_OTHER): Payer: Self-pay | Admitting: Ophthalmology

## 2022-04-03 ENCOUNTER — Encounter (INDEPENDENT_AMBULATORY_CARE_PROVIDER_SITE_OTHER): Payer: Medicare HMO | Admitting: Ophthalmology

## 2022-04-03 DIAGNOSIS — Z794 Long term (current) use of insulin: Secondary | ICD-10-CM | POA: Diagnosis not present

## 2022-04-03 DIAGNOSIS — E113492 Type 2 diabetes mellitus with severe nonproliferative diabetic retinopathy without macular edema, left eye: Secondary | ICD-10-CM

## 2022-04-03 DIAGNOSIS — H43823 Vitreomacular adhesion, bilateral: Secondary | ICD-10-CM

## 2022-04-03 DIAGNOSIS — E113412 Type 2 diabetes mellitus with severe nonproliferative diabetic retinopathy with macular edema, left eye: Secondary | ICD-10-CM | POA: Diagnosis not present

## 2022-04-03 DIAGNOSIS — Z7984 Long term (current) use of oral hypoglycemic drugs: Secondary | ICD-10-CM

## 2022-04-03 DIAGNOSIS — E113491 Type 2 diabetes mellitus with severe nonproliferative diabetic retinopathy without macular edema, right eye: Secondary | ICD-10-CM

## 2022-04-03 NOTE — Assessment & Plan Note (Signed)
Stable OU physiologic

## 2022-04-03 NOTE — Assessment & Plan Note (Signed)
Stable no signs of progressionThe nature of severe nonproliferative diabetic retinopathy discussed with the patient as well as the need for more frequent follow up and likely progression to proliferative disease in the near future. The options of continued observation versus panretinal photocoagulation at this time were reviewed as well as the risks, benefits, and alternatives. More recent option includes the use of ocular injectable medications to slow progression of retinal disease. Tight control of glucose, blood pressure, and serum lipid levels were recommended under the direction of general physician or endocrinologist, as well as avoidance of smoking and maintenance of normal body weight. The 2-year risk of progression to proliferative diabetic retinopathy is 60%.

## 2022-04-03 NOTE — Progress Notes (Signed)
04/03/2022     CHIEF COMPLAINT Patient presents for  Chief Complaint  Patient presents with   Diabetic Retinopathy without Macular Edema      HISTORY OF PRESENT ILLNESS: Leslie Scott is a 56 y.o. female who presents to the clinic today for:   HPI   9 MOS for DILATE OU, COLOR FP, OCT. Pt stated vision remained stable since last visit.  Pt reports, "Maybe a couple of weeks ago, I felt a little pinch in my right eye and then it was red. I don't know if a little vein popped. It didn't happen again and the pain was temporary." Pt's last A1C was   Last edited by Silvestre Moment on 04/03/2022 10:08 AM.      Referring physician: Benito Mccreedy, MD [id number] [place],  [place] [address]  HISTORICAL INFORMATION:   Selected notes from the [facility]: No current outpatient medications on file. (Ophthalmic Drugs)   No current facility-administered medications for this visit. (Ophthalmic Drugs)   Current Outpatient Medications (Other)  Medication Sig   diphenhydrAMINE (BENADRYL) 25 MG tablet Take 1 tablet (25 mg total) by mouth every 6 (six) hours.   Dulaglutide (TRULICITY) 1.5 FI/4.3PI SOPN Inject into the skin once a week.   famotidine (PEPCID) 20 MG tablet Take 1 tablet (20 mg total) by mouth 2 (two) times daily.   insulin glargine (LANTUS) 100 UNIT/ML injection Inject 26 Units into the skin 2 (two) times daily.    loratadine (CLARITIN) 10 MG tablet Take 10 mg by mouth daily as needed.    losartan (COZAAR) 25 MG tablet Take 25 mg by mouth every morning.    metoprolol succinate (TOPROL-XL) 50 MG 24 hr tablet Take 50 mg by mouth every morning.    mometasone-formoterol (DULERA) 100-5 MCG/ACT AERO Take 2 puffs first thing in am and then another 2 puffs about 12 hours later. (Patient taking differently: Inhale 1 puff into the lungs every morning. Take 2 puffs first thing in am and then another 2 puffs about 12 hours later.)   omeprazole  (PRILOSEC) 40 MG capsule Take 40 mg by mouth daily.   sitaGLIPtin-metformin (JANUMET) 50-1000 MG per tablet Take 1 tablet by mouth 2 (two) times daily.    VENTOLIN HFA 108 (90 Base) MCG/ACT inhaler INHALE 2 PUFFS INTO THE LUNGS EVERY 6 (SIX) HOURS AS NEEDED FOR WHEEZING OR SHORTNESS OF BREATH.   No current facility-administered medications for this visit. (Other)      REVIEW OF SYSTEMS: ROS   Negative for: Constitutional, Gastrointestinal, Neurological, Skin, Genitourinary, Musculoskeletal, HENT, Endocrine, Cardiovascular, Eyes, Respiratory, Psychiatric, Allergic/Imm, Heme/Lymph Last edited by Silvestre Moment on 04/03/2022 10:07 AM.       ALLERGIES Allergies  Allergen Reactions   Morphine And Related Shortness Of Breath   Shellfish Allergy Anaphylaxis    ALL SEAFOOD    PAST MEDICAL HISTORY Past Medical History:  Diagnosis Date   Adenotonsillar hypertrophy    Arthritis    shoulders   GERD (gastroesophageal reflux disease)    Hypertension    Mild persistent asthma    pulmologist-  dr wert   OSA (obstructive sleep apnea)    per pt no cpap   Palpitations    SUI (stress urinary incontinence, female)    Type 2 diabetes mellitus treated with insulin ([place])    followed by pcp   Past Surgical History:  Procedure Laterality Date   ABDOMINOPLASTY  2017  w/ Lipo   BREAST ENHANCEMENT SURGERY Bilateral 2001   and Abdominoplasty   CATARACT EXTRACTION W/ INTRAOCULAR LENS  IMPLANT, BILATERAL  2013   CYSTOSCOPY N/A 01/27/2018   Procedure: CYSTOSCOPY;  Surgeon: Franchot Gallo, MD;  Location: [facility];  Service: Urology;  Laterality: N/A;   PUBOVAGINAL SLING N/A 01/27/2018   Procedure: Gaynelle Arabian, Janyth Pupa;  Surgeon: Franchot Gallo, MD;  Location: [facility];  Service: Urology;  Laterality: N/A;   TONSILLECTOMY AND ADENOIDECTOMY Bilateral 03/03/2019   Procedure: TONSILLECTOMY AND ADENOIDECTOMY;  Surgeon: Leta Baptist, MD;  Location: [facility];  Service: ENT;  Laterality: Bilateral;    FAMILY HISTORY Family History  Problem Relation Age of Onset   [place] cancer Maternal Aunt    Allergies Mother    Asthma Mother     SOCIAL HISTORY Social History   Tobacco Use   Smoking status: Former    Packs/day: 0.50    Years: 20.00    Total pack years: 10.00    Types: Cigarettes    Quit date: 08/06/2005    Years since quitting: 16.6   Smokeless tobacco: Never  Vaping Use   Vaping Use: Never used  Substance Use Topics   Alcohol use: Yes    Alcohol/week: 0.0 standard drinks of alcohol    Comment: OCCASIONAL   Drug use: No         OPHTHALMIC EXAM:  Base Eye Exam     Visual Acuity (ETDRS)       Right Left   Dist [place] 20/25 -2 20/20         Tonometry (Tonopen, 10:15 AM)       Right Left   Pressure 18 18         Pupils       Pupils APD   Right PERRL None   Left PERRL None         Visual Fields       Left Right    Full Full         Extraocular Movement       Right Left    Full, Ortho Full, Ortho         Neuro/Psych     Oriented x3: Yes   Mood/Affect: Normal         Dilation     Both eyes: 1.0% Mydriacyl, 2.5% Phenylephrine @ 10:15 AM           Slit Lamp and Fundus Exam     External Exam       Right Left   External Normal Normal         Slit Lamp Exam       Right Left   Lids/Lashes Normal Normal   Conjunctiva/Sclera White and quiet White and quiet   Cornea Clear Clear   Anterior Chamber Deep and quiet Deep and quiet   Iris Round and reactive Round and reactive   Lens Posterior chamber intraocular lens Posterior chamber intraocular lens   Anterior Vitreous Normal Normal         Fundus Exam       Right Left   Posterior Vitreous Normal Normal   Disc Normal Normal   C/D Ratio 0.25 0.3   Macula Microaneurysms, no clinically significant macular edema no exudates, no clinically significant macular edema, Microaneurysms   Vessels NPDR-Severe, stable  NPDR-Severe, stable   Periphery Normal Normal            IMAGING AND PROCEDURES  Imaging and Procedures for 04/03/22  OCT, Retina - OU - Both Eyes       Right Eye Quality was good. Scan locations included subfoveal. Central Foveal Thickness: 256. Progression has been stable. Findings include normal foveal contour, vitreomacular adhesion .   Left Eye Quality was good. Scan locations included subfoveal. Central Foveal Thickness: 258. Progression has been stable. Findings include normal foveal contour, vitreomacular adhesion .   Notes No active maculopathy OU, no CSME OU Observe      Color Fundus Photography Optos - OU - Both Eyes       Right Eye Progression has been stable. Disc findings include normal observations. Macula : microaneurysms. Periphery : exudates.   Left Eye Progression has been stable. Disc findings include normal observations. Macula : microaneurysms. Periphery : exudates.   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 The options of continued observation versus panretinal photocoagulation at this time were reviewed as well as the risks, benefits, and alternatives. More recent option includes the use of ocular injectable medications to slow progression of retinal disease. Tight control of glucose, blood pressure, and serum lipid levels were recommended under the direction of general physician or endocrinologist, as well as avoidance of smoking and maintenance of normal body weight. The 2-year risk of progression to proliferative diabetic retinopathy is 60%.  Severe nonproliferative diabetic retinopathy of right eye  without macular edema associated with type 2 diabetes mellitus ([place]) The nature of severe nonproliferative diabetic retinopathy discussed with the patient as well as the need for more frequent follow up and likely progression to proliferative disease in the near future. The options of continued observation versus panretinal photocoagulation at this time were reviewed as well as the risks, benefits, and alternatives. More recent option includes the use of ocular injectable medications to slow progression of retinal disease. Tight control of glucose, blood pressure, and serum lipid levels were recommended under the direction of general physician or endocrinologist, as well as avoidance of smoking and maintenance of normal body weight. The 2-year risk of progression to proliferative diabetic retinopathy is 60%. Stable retinopathy with a current A1c of 10.6 we will need to see the patient again in 6 months  Vitreomacular adhesion of both eyes Stable OU physiologic     ICD-10-CM   1. Severe nonproliferative diabetic retinopathy of left eye, with macular edema, associated with type 2 diabetes mellitus (HCC)  F57.3220 OCT, Retina - OU - Both Eyes    Color Fundus Photography Optos - OU - Both Eyes    2. Severe nonproliferative diabetic retinopathy of left eye associated with type 2 diabetes mellitus, macular edema presence unspecified ([place])  U54.2706     3. Severe nonproliferative diabetic retinopathy of right eye without macular edema associated with type 2 diabetes mellitus ([place])  E11.3491     4. Vitreomacular adhesion of both eyes  H43.823       1.  OU with history of CSME each stabilized now combination antivegF and focal laser.  No sign of recurrence today at 46-monthfollow-up.  2.  Severe NPDR stable no progression.  Nonetheless risk for progression with high A1c  at this time.  3.  Ophthalmic Meds Ordered this visit:  No orders of the defined types were placed in this encounter.       Return in about 6 months (around 10/04/2022) for DILATE OU, COLOR FP, OCT.  There are no Patient Instructions on file for this visit.   Explained the diagnoses, plan, and follow up with the patient and they expressed understanding.  Patient expressed understanding of the importance of proper follow up care.   Clent Demark Mairen Wallenstein M.D. Diseases & Surgery of the Retina and Vitreous [facility]ina [facility] [place] 04/03/22     Abbreviations: M myopia (nearsighted); A astigmatism; H hyperopia (farsighted); P presbyopia; Mrx spectacle prescription;  CTL contact lenses; OD right eye; OS left eye; OU both eyes  XT exotropia; ET esotropia; PEK punctate epithelial keratitis; PEE punctate epithelial erosions; DES dry eye syndrome; MGD meibomian gland dysfunction; ATs artificial tears; PFAT's preservative free artificial tears; [place] nuclear sclerotic cataract; PSC posterior subcapsular cataract; ERM epi-retinal membrane; PVD posterior vitreous detachment; RD retinal detachment; DM diabetes mellitus; DR diabetic retinopathy; NPDR non-proliferative diabetic retinopathy; PDR proliferative diabetic retinopathy; CSME clinically significant macular edema; DME diabetic macular edema; dbh dot blot hemorrhages; CWS cotton wool spot; POAG primary open angle glaucoma; C/D cup-to-disc ratio; HVF humphrey visual field; GVF goldmann visual field; OCT optical coherence tomography; IOP intraocular pressure; BRVO Branch retinal vein occlusion; CRVO central retinal vein occlusion; CRAO central retinal artery occlusion; BRAO branch retinal artery occlusion; RT retinal tear; SB scleral buckle; PPV pars plana vitrectomy; VH Vitreous hemorrhage; PRP panretinal laser photocoagulation; IVK intravitreal kenalog; VMT vitreomacular traction; MH Macular hole;  NVD neovascularization of the disc; NVE neovascularization elsewhere; AREDS age related eye disease study; ARMD age related macular degeneration; POAG primary open angle glaucoma; EBMD  epithelial/anterior basement membrane dystrophy; ACIOL anterior chamber intraocular lens; IOL intraocular lens; PCIOL posterior chamber intraocular lens; Phaco/IOL phacoemulsification with intraocular lens placement; [place] photorefractive keratectomy; LASIK laser assisted in situ keratomileusis; HTN hypertension; DM diabetes mellitus; COPD chronic obstructive pulmonary disease

## 2022-04-03 NOTE — Assessment & Plan Note (Signed)
The nature of severe nonproliferative diabetic retinopathy discussed with the patient as well as the need for more frequent follow up and likely progression to proliferative disease in the near future. The options of continued observation versus panretinal photocoagulation at this time were reviewed as well as the risks, benefits, and alternatives. More recent option includes the use of ocular injectable medications to slow progression of retinal disease. Tight control of glucose, blood pressure, and serum lipid levels were recommended under the direction of general physician or endocrinologist, as well as avoidance of smoking and maintenance of normal body weight. The 2-year risk of progression to proliferative diabetic retinopathy is 60%. Stable retinopathy with a current A1c of 10.6 we will need to see the patient again in 6 months

## 2022-04-04 DIAGNOSIS — E782 Mixed hyperlipidemia: Secondary | ICD-10-CM | POA: Diagnosis not present

## 2022-04-04 DIAGNOSIS — I10 Essential (primary) hypertension: Secondary | ICD-10-CM | POA: Diagnosis not present

## 2022-04-04 DIAGNOSIS — E1165 Type 2 diabetes mellitus with hyperglycemia: Secondary | ICD-10-CM | POA: Diagnosis not present

## 2022-04-04 DIAGNOSIS — J302 Other seasonal allergic rhinitis: Secondary | ICD-10-CM | POA: Diagnosis not present

## 2022-04-04 DIAGNOSIS — J45909 Unspecified asthma, uncomplicated: Secondary | ICD-10-CM | POA: Diagnosis not present

## 2022-04-04 DIAGNOSIS — K219 Gastro-esophageal reflux disease without esophagitis: Secondary | ICD-10-CM | POA: Diagnosis not present

## 2022-04-22 NOTE — Progress Notes (Unsigned)
Subjective:    Patient ID: Leslie Scott, female    DOB: [birth year]    MRN: [id number]    Brief patient profile:  56 yo [place] former smoker  with asthma since age 40 on Theophylline ever since better in 2006 breathing while in [place] and reduction to may be once a day but breathing worse in 2014 despite quit smoking 2007 [place] 2013 and referred to pulmonary clinic 08/12/13 by Dr Doristine Section bonsu for eval of lung nodule      History of Present Illness  08/12/2014 1st [facility] office visit/ Leslie Scott  / maintained on advair and theoph Chief Complaint  Patient presents with   Pulmonary Consult    Referred by Dr. Vista Lawman for eval of pulmonary nodule. Pt states that she was dxed with asthma at age 60. She c/o increased SOB for the past yr.  She states that she gets SOB with "any rapid movement".    last pred x 6 months/ still on theoph with recent dx of gerd at [facility]/ has chest tightness better with saba and doe x anything more than slow adls x years rec No more CT scans for 6 months  Stop theophylline and advair  Dulera 200 Take 2 puffs first thing in am and then another 2 puffs about 12 hours later and if happy with it then fill the Rx  Work on inhaler technique:  relax and gently blow all the way out then take a nice smooth deep breath back in, triggering the inhaler at same time you start breathing in.  Hold for up to 5 seconds if you can.  Rinse and gargle with water when done Only use your albuterol(proair)  as a rescue medication Nexium 40 mg Take 30- 60 min before your first and last meals of the day  GERD  Diet    09/24/2014 f/u ov/Leslie Scott re: asthma f/u  Chief Complaint  Patient presents with   Follow-up    PFT done today. Pt states that her breathing has improved since the last visit. No new co's today.    Not limited by breathing from desired activities  / no need for saba in any form  rec Continue dulera 200 Take 2 puffs first thing in am and then another 2 puffs about  12 hours later.  Work on Engineer, technical sales technique:     08/18/2015  f/u ov/Leslie Scott re: asthma/ mpns Chief Complaint  Patient presents with   Follow-up    occ sob noticed more when going up stairs, occ wheezing. denies any cp/tightness.   no need for saba at all/ no noct spells  rec Work on inhaler technique:   Decrease dulera to 100 2 every 12 hours if do just as well as on the 200 dose fill the prescription    02/24/2016  f/u ov/Leslie Scott re: asthma/ happy with dulera 200 one twice  Daily (misunderstood instructions Chief Complaint  Patient presents with   Follow-up    Breathing is doing well on Dulera 100. She has not had to use albuterol inhaler or neb. No new co's today.    Not limited by breathing from desired activities  Rec Plan A = Automatic =  Dulera 100 Take 2 puffs first thing in am and then another 2 puffs about 12 hours later.  Work on inhaler technique:   Plan B = Backup Only use your albuterol (ventolin)  as a rescue medication  Plan C = Crisis - only use  your albuterol nebulizer if you first try Plan B   Pulmonary follow up is as needed      04/23/2022  Re-establish ov/Leslie Scott re: ***   maint on ***  No chief complaint on file.   Dyspnea:  *** Cough: *** Sleeping: *** SABA use: *** 02: *** Covid status:   ***   No obvious day to day or daytime variability or assoc excess/ purulent sputum or mucus plugs or hemoptysis or cp or chest tightness, subjective wheeze or overt sinus or hb symptoms.   *** without nocturnal  or early am exacerbation  of respiratory  c/o's or need for noct saba. Also denies any obvious fluctuation of symptoms with weather or environmental changes or other aggravating or alleviating factors except as outlined above   No unusual exposure hx or h/o childhood pna/ asthma or knowledge of premature birth.  Current Allergies, Complete Past Medical History, Past Surgical History, Family History, and Social History were reviewed in [facility] record.  ROS  The following are not active complaints unless bolded Hoarseness, sore throat, dysphagia, dental problems, itching, sneezing,  nasal congestion or discharge of excess mucus or purulent secretions, ear ache,   fever, chills, sweats, unintended wt loss or wt gain, classically pleuritic or exertional cp,  orthopnea pnd or arm/hand swelling  or leg swelling, presyncope, palpitations, abdominal pain, anorexia, nausea, vomiting, diarrhea  or change in bowel habits or change in bladder habits, change in stools or change in urine, dysuria, hematuria,  rash, arthralgias, visual complaints, headache, numbness, weakness or ataxia or problems with walking or coordination,  change in mood or  memory.        No outpatient medications have been marked as taking for the 04/23/22 encounter (Appointment) with Tanda Rockers, MD.                Objective:   Physical Exam    04/23/2022         ***  09/24/2014        184  > 08/18/2015 180 > 02/24/2016  172     08/12/14 175 lb (79.379 kg)  03/31/14 170 lb (77.111 kg)  03/30/14 170 lb (77.111 kg)      Vital signs reviewed  04/23/2022  - Note at rest 02 sats  ***% on ***   General appearance:    ***          Assessment & Plan:

## 2022-04-23 ENCOUNTER — Encounter: Payer: Self-pay | Admitting: Internal Medicine

## 2022-04-23 ENCOUNTER — Ambulatory Visit (INDEPENDENT_AMBULATORY_CARE_PROVIDER_SITE_OTHER): Payer: Medicare HMO | Admitting: Internal Medicine

## 2022-04-23 ENCOUNTER — Ambulatory Visit (INDEPENDENT_AMBULATORY_CARE_PROVIDER_SITE_OTHER): Payer: Medicare HMO

## 2022-04-23 DIAGNOSIS — R062 Wheezing: Secondary | ICD-10-CM | POA: Diagnosis not present

## 2022-04-23 DIAGNOSIS — J4530 Mild persistent asthma, uncomplicated: Secondary | ICD-10-CM

## 2022-04-23 MED ORDER — BENZONATATE 200 MG PO CAPS: 200.0000 mg | ORAL_CAPSULE | Freq: Three times a day (TID) | ORAL | 1 refills | Status: DC | PRN

## 2022-04-23 NOTE — Patient Instructions (Addendum)
Try prilosec (omeprzole)  '40mg'$   Take 30-60 min before first meal of the day and Pepcid ac (famotidine) 20 mg one after supper or  bedtime until cough is completely gone for at least a week without the need for cough suppression  GERD (REFLUX)  is an extremely common cause of respiratory symptoms just like yours , many times with no obvious heartburn at all.    It can be treated with medication, but also with lifestyle changes including elevation of the head of your bed (ideally with 6 -8inch blocks under the headboard of your bed),  Smoking cessation, avoidance of late meals, excessive alcohol, and avoid fatty foods, chocolate, peppermint, colas, red wine, and acidic juices such as orange juice.  NO MINT OR MENTHOL PRODUCTS SO NO COUGH DROPS  USE SUGARLESS CANDY INSTEAD (Jolley ranchers or Stover's or Life Savers) or even ice chips will also do - the key is to swallow to prevent all throat clearing. NO OIL BASED VITAMINS - use powdered substitutes.  Avoid fish oil when coughing.   Plan A = Automatic = Always=    Symbicort 80 Take 2 puffs first thing in am and then another 2 puffs about 12 hours later.    Work on inhaler technique:  relax and gently blow all the way out then take a nice smooth full deep breath back in, triggering the inhaler at same time you start breathing in.  Hold breath in for at least  5 seconds if you can. Blow out symbicort  thru nose. Rinse and gargle with water when done.  If mouth or throat bother you at all,  try brushing teeth/gums/tongue with arm and hammer toothpaste/ make a slurry and gargle and spit out.   - remember how golfers take practice swings      Plan B = Backup (to supplement plan A, not to replace it) Only use your albuterol inhaler as a rescue medication to be used if you can't catch your breath by resting or doing a relaxed purse lip breathing pattern.  - The less you use it, the better it will work when you need it. - Ok to use the inhaler up to 2 puffs   every 4 hours if you must but call for appointment if use goes up over your usual need - Don't leave home without it !!  (think of it like the spare tire for your car)   For cough > tessalon 200 mg every 4-6 hours   Please remember to go to the  x-ray department  for your tests - we will call you with the results when they are available    Please schedule a follow up office visit in 4 weeks, sooner if needed  with all medications /inhalers/ solutions in hand so we can verify exactly what you are taking. This includes all medications from all doctors and over the counters

## 2022-04-23 NOTE — Assessment & Plan Note (Signed)
Onset childhood while in [place] rx theop d/c'd  08/12/14 D - PFT's 09/24/14  wnl except for ERV 19% and non specific reduction in [id number]  -08/18/2015    try step down to dulera 100 > took 200 one bid instead - 02/24/2016  After extensive coaching HFA effectiveness =    75% try dulea 100 2bid> did fine until covid 24 May 2020 despite non-adherence due to insurance restrictions - Aug 2023 restarted symb 80 prn but sill overusing hfa - 04/23/2022  After extensive coaching inhaler device,  effectiveness =    75% from a baseline of 50 % so rec symb 80 2bid (not prn) max gerd rx / tessalon 200 prn and and approp use of saba  Re SABA :  I spent extra time with pt today reviewing appropriate use of albuterol for prn use on exertion with the following points: 1) saba is for relief of sob that does not improve by walking a slower pace or resting but rather if the pt does not improve after trying this first. 2) If the pt is convinced, as many are, that saba helps recover from activity faster then it's easy to tell if this is the case by re-challenging : ie stop, take the inhaler, then p 5 minutes try the exact same activity (intensity of workload) that just caused the symptoms and see if they are substantially diminished or not after saba 3) if there is an activity that reproducibly causes the symptoms, try the saba 15 min before the activity on alternate days   If in fact the saba really does help, then fine to continue to use it prn but advised may need to look closer at the maintenance regimen being used to achieve better control of airways disease with exertion.   F/u in 4 weeks with all meds in hand using a trust but verify approach to confirm accurate Medication  Reconciliation The principal here is that until we are certain that the  patients are doing what we've asked, it makes no sense to ask them to do more.

## 2022-04-24 ENCOUNTER — Encounter: Payer: Self-pay | Admitting: Internal Medicine

## 2022-04-25 NOTE — Progress Notes (Signed)
Spoke with pt and notified of results per Dr. Wert. Pt verbalized understanding and denied any questions. 

## 2022-05-03 DIAGNOSIS — Z23 Encounter for immunization: Secondary | ICD-10-CM | POA: Diagnosis not present

## 2022-05-03 DIAGNOSIS — E1165 Type 2 diabetes mellitus with hyperglycemia: Secondary | ICD-10-CM | POA: Diagnosis not present

## 2022-05-03 DIAGNOSIS — J302 Other seasonal allergic rhinitis: Secondary | ICD-10-CM | POA: Diagnosis not present

## 2022-05-03 DIAGNOSIS — K219 Gastro-esophageal reflux disease without esophagitis: Secondary | ICD-10-CM | POA: Diagnosis not present

## 2022-05-03 DIAGNOSIS — I10 Essential (primary) hypertension: Secondary | ICD-10-CM | POA: Diagnosis not present

## 2022-05-03 DIAGNOSIS — E782 Mixed hyperlipidemia: Secondary | ICD-10-CM | POA: Diagnosis not present

## 2022-05-03 DIAGNOSIS — J45909 Unspecified asthma, uncomplicated: Secondary | ICD-10-CM | POA: Diagnosis not present

## 2022-05-18 DIAGNOSIS — J45909 Unspecified asthma, uncomplicated: Secondary | ICD-10-CM | POA: Diagnosis not present

## 2022-05-18 DIAGNOSIS — K219 Gastro-esophageal reflux disease without esophagitis: Secondary | ICD-10-CM | POA: Diagnosis not present

## 2022-05-18 DIAGNOSIS — I10 Essential (primary) hypertension: Secondary | ICD-10-CM | POA: Diagnosis not present

## 2022-05-18 DIAGNOSIS — E782 Mixed hyperlipidemia: Secondary | ICD-10-CM | POA: Diagnosis not present

## 2022-05-18 DIAGNOSIS — J302 Other seasonal allergic rhinitis: Secondary | ICD-10-CM | POA: Diagnosis not present

## 2022-05-18 DIAGNOSIS — E1165 Type 2 diabetes mellitus with hyperglycemia: Secondary | ICD-10-CM | POA: Diagnosis not present

## 2022-05-22 ENCOUNTER — Ambulatory Visit (INDEPENDENT_AMBULATORY_CARE_PROVIDER_SITE_OTHER): Payer: Medicare HMO | Admitting: Internal Medicine

## 2022-05-22 ENCOUNTER — Encounter: Payer: Self-pay | Admitting: Internal Medicine

## 2022-05-22 DIAGNOSIS — J4530 Mild persistent asthma, uncomplicated: Secondary | ICD-10-CM

## 2022-05-22 DIAGNOSIS — R0609 Other forms of dyspnea: Secondary | ICD-10-CM | POA: Diagnosis not present

## 2022-05-22 MED ORDER — BUDESONIDE-FORMOTEROL FUMARATE 80-4.5 MCG/ACT IN AERO
INHALATION_SPRAY | RESPIRATORY_TRACT | 12 refills | Status: DC
Start: 2022-05-22 — End: 2023-05-15

## 2022-05-22 MED ORDER — BENZONATATE 200 MG PO CAPS
200.0000 mg | ORAL_CAPSULE | Freq: Three times a day (TID) | ORAL | 1 refills | Status: DC | PRN
Start: 2022-05-22 — End: 2023-09-24

## 2022-05-22 NOTE — Assessment & Plan Note (Addendum)
Onset p covid 2021 -  05/22/2022   Walked on RA  x  3  lap(s) =  approx 450  ft  @ nl  pace, stopped due to end of study with lowest 02 sats 95% and no sob    Doubt EIA or airflow limiting activity tol:  rec sub max ex and saba pre ex on prn basis   F/u in 6 m with all meds in hand using a trust but verify approach to confirm accurate Medication  Reconciliation The principal here is that until we are certain that the  patients are doing what we've asked, it makes no sense to ask them to do more.   Each maintenance medication was reviewed in detail including emphasizing most importantly the difference between maintenance and prns and under what circumstances the prns are to be triggered using an action plan format where appropriate.  Total time for H and P, chart review, counseling, reviewing hfa device(s) , directly observing portions of ambulatory 02 saturation study/ and generating customized AVS unique to this office visit / same day charting > 30 min

## 2022-05-22 NOTE — Progress Notes (Signed)
Subjective:    Patient ID: Leslie Scott, female    DOB: [birth year]    MRN: [id number]    Brief patient profile:  56 yo [place] former smoker  with asthma since age 44 on Theophylline ever since better in 2006 breathing while in [place] and reduction to may be once a day but breathing worse in 2014 despite quit smoking 2007 [place] and referred to pulmonary clinic 08/12/13 by Dr Doristine Section bonsu for eval of lung nodule      History of Present Illness  08/12/2014 1st [facility] office visit/ Melvyn Novas  / maintained on advair and theoph Chief Complaint  Patient presents with   Pulmonary Consult    Referred by Dr. Vista Lawman for eval of pulmonary nodule. Pt states that she was dxed with asthma at age 1. She c/o increased SOB for the past yr.  She states that she gets SOB with "any rapid movement".    last pred x 6 months/ still on theoph with recent dx of gerd at [facility]/ has chest tightness better with saba and doe x anything more than slow adls x years rec No more CT scans for 6 months  Stop theophylline and advair  Dulera 200 Take 2 puffs first thing in am and then another 2 puffs about 12 hours later and if happy with it then fill the Rx  Work on inhaler technique:  relax and gently blow all the way out then take a nice smooth deep breath back in, triggering the inhaler at same time you start breathing in.  Hold for up to 5 seconds if you can.  Rinse and gargle with water when done Only use your albuterol(proair)  as a rescue medication Nexium 40 mg Take 30- 60 min before your first and last meals of the day  GERD  Diet     02/24/2016  f/u ov/Greenly Rarick re: asthma/ happy with dulera 200 one twice  Daily (misunderstood instructions Chief Complaint  Patient presents with   Follow-up    Breathing is doing well on Dulera 100. She has not had to use albuterol inhaler or neb. No new co's today.    Not limited by breathing from desired activities  Rec Plan A = Automatic =  Dulera 100 Take 2  puffs first thing in am and then another 2 puffs about 12 hours later.  Work on inhaler technique:   Plan B = Backup Only use your albuterol (ventolin)  as a rescue medication  Plan C = Crisis - only use your albuterol nebulizer if you first try Plan B   Pulmonary follow up is as needed      04/23/2022  Re-establish ov/Zeeva Courser re: asthma maint on symbicort 80 prn  Chief Complaint  Patient presents with   Consult    Asthma  Prior to covid 19 2nd infection Oct 2021 off dulera for years but since then noted increase for albuterol which was not true prior to covid   Dyspnea:  grocery  store is a challenging but still doing slow pace Cough: not an issue  Sleeping: flat bed 2 pillows on side no resp cc   SABA use: avg 4 x daiy  02: none  Covid status:   vax  x 3 and also infected x 2  Rec Try prilosec (omeprzole)  '40mg'$   Take 30-60 min before first meal of the day and Pepcid ac (famotidine) 20 mg one after supper or  bedtime until cough is completely  gone for at least a week without the need for cough suppression GERD diet reviewed, bed blocks rec  Plan A = Automatic = Always=    Symbicort 80 Take 2 puffs first thing in am and then another 2 puffs about 12 hours later.  Work on inhaler technique:  - remember how golfers take practice swings    Plan B = Backup (to supplement plan A, not to replace it) Only use your albuterol inhaler as a rescue medication  For cough > tessalon 200 mg every 4-6 hours  Please remember to go to the  x-ray department  cxr ok   Please schedule a follow up office visit in 4 weeks, sooner if needed  with all medications /inhalers/ solutions in hand    05/22/2022  f/u ov/[place] re: asthma maint on symbicort 80 2bid  did bring meds  Chief Complaint  Patient presents with   Follow-up    No new issues since LOV  Dyspnea:  still  not back to normal eg having trouble finishing [employer] s resting  Cough: none  Sleeping: flat bed / 1-2 pillows on side  SABA use: 2 x  per week 02: none    No obvious day to day or daytime variability or assoc excess/ purulent sputum or mucus plugs or hemoptysis or cp or chest tightness, subjective wheeze or overt sinus or hb symptoms.   Sleeping  without nocturnal  or early am exacerbation  of respiratory  c/o's or need for noct saba. Also denies any obvious fluctuation of symptoms with weather or environmental changes or other aggravating or alleviating factors except as outlined above   No unusual exposure hx or h/o childhood pna/ asthma or knowledge of premature birth.  Current Allergies, Complete Past Medical History, Past Surgical History, Family History, and Social History were reviewed in [employer] record.  ROS  The following are not active complaints unless bolded Hoarseness, sore throat, dysphagia, dental problems, itching, sneezing,  nasal congestion or discharge of excess mucus or purulent secretions, ear ache,   fever, chills, sweats, unintended wt loss or wt gain, classically pleuritic or exertional cp,  orthopnea pnd or arm/hand swelling  or leg swelling, presyncope, palpitations, abdominal pain, anorexia, nausea, vomiting, diarrhea  or change in bowel habits or change in bladder habits, change in stools or change in urine, dysuria, hematuria,  rash, arthralgias, visual complaints, headache, numbness, weakness or ataxia or problems with walking or coordination,  change in mood or  memory.        Current Meds  Medication Sig   famotidine (PEPCID) 20 MG tablet Take 1 tablet (20 mg total) by mouth 2 (two) times daily.   insulin glargine (LANTUS) 100 UNIT/ML injection Inject 26 Units into the skin 2 (two) times daily.   losartan (COZAAR) 25 MG tablet Take 25 mg by mouth every morning.    metoprolol succinate (TOPROL-XL) 50 MG 24 hr tablet Take 50 mg by mouth every morning.    mometasone-formoterol (DULERA) 100-5 MCG/ACT AERO Take 2 puffs first thing in am and then another 2 puffs about 12  hours later.   omeprazole (PRILOSEC) 40 MG capsule Take 40 mg by mouth daily.   sitaGLIPtin-metformin (JANUMET) 50-1000 MG per tablet Take 1 tablet by mouth 2 (two) times daily.    VENTOLIN HFA 108 (90 Base) MCG/ACT inhaler INHALE 2 PUFFS INTO THE LUNGS EVERY 6 (SIX) HOURS AS NEEDED FOR WHEEZING OR SHORTNESS OF BREATH.  Objective:   Physical Exam  Wts  05/22/2022       161  04/23/2022        165   09/24/2014        184  > 08/18/2015 180 > 02/24/2016  172     08/12/14 175 lb (79.379 kg)  03/31/14 170 lb (77.111 kg)  03/30/14 170 lb (77.111 kg)     Vital signs reviewed  05/22/2022  - Note at rest 02 sats  % on RA   General appearance:    amb PR female nad   HEENT : Oropharynx  clear      Nasal turbinates nl    NECK :  without  apparent JVD/ palpable Nodes/TM    LUNGS: no acc muscle use,  Nl contour chest which is clear to A and P bilaterally without cough on insp or exp maneuvers   CV:  RRR  no s3 or murmur or increase in P2, and no edema   ABD:  soft and nontender with nl inspiratory excursion in the supine position. No bruits or organomegaly appreciated   MS:  Nl gait/ ext warm without deformities Or obvious joint restrictions  calf tenderness, cyanosis or clubbing    SKIN: warm and dry without lesions    NEURO:  alert, approp, nl sensorium with  no motor or cerebellar deficits apparent.         I personally reviewed images and agree with radiology impression as follows:  CXR:   pa and laeral 04/23/22 Wnl  Assessment & Plan:

## 2022-05-22 NOTE — Assessment & Plan Note (Signed)
Onset childhood while in [place] rx theop d/c'd  08/12/14 D - PFT's 09/24/14  wnl except for ERV 19% and non specific reduction in [id number]  -08/18/2015    try step down to dulera 100 > took 200 one bid instead - 02/24/2016  After extensive coaching HFA effectiveness =    75% try dulea 100 2bid> did fine until covid 24 May 2020 despite non-adherence due to insurance restrictions - Aug 2023 restarted symb 80 prn but sill overusing hfa - 04/23/2022  After extensive coaching inhaler device,  effectiveness =    75% from a baseline of 50 % so rec symb 80 2bid (not prn) max gerd rx / tessalon 200 prn and and approp use of saba   - The proper method of use, as well as anticipated side effects, of a metered-dose inhaler were discussed and demonstrated to the patient using teach back method.   All goals of chronic asthma control met including optimal function and elimination of symptoms with minimal need for rescue therapy.  Contingencies discussed in full including contacting this office immediately if not controlling the symptoms using the rule of two's.     >>> continue symb 80 2bid and back off gerd rx unless cough/ wheeze or overt HB

## 2022-05-22 NOTE — Patient Instructions (Addendum)
Try prilosec otc '20mg'$   Take 30-60 min before first meal of the day and Pepcid ac (famotidine) 20 mg one @  bedtime until cough is completely gone for at least a week without the need for cough suppression    Plan A = Automatic = Always=    Symbicort 80 Take 2 puffs first thing in am and then another 2 puffs about 12 hours later.     Plan B = Backup (to supplement plan A, not to replace it) Only use your albuterol inhaler as a rescue medication to be used if you can't catch your breath by resting or doing a relaxed purse lip breathing pattern.  - The less you use it, the better it will work when you need it. - Ok to use the inhaler up to 2 puffs  every 4 hours if you must but call for appointment if use goes up over your usual need - Don't leave home without it !!  (think of it like the spare tire for your car)   Plan C = Crisis (instead of Plan B but only if Plan B stops working) - only use your albuterol nebulizer if you first try Plan B and it fails to help > ok to use the nebulizer up to every 4 hours but if start needing it regularly call for immediate appointment    For cough  >  tessalon 200 mg every 4-6 hours     Please schedule a follow up visit in 6 months but call sooner if needed  with all medications /inhalers/ solutions in hand so we can verify exactly what you are taking. This includes all medications from all doctors and over the counters

## 2022-07-05 DIAGNOSIS — E1165 Type 2 diabetes mellitus with hyperglycemia: Secondary | ICD-10-CM | POA: Diagnosis not present

## 2022-07-10 DIAGNOSIS — I10 Essential (primary) hypertension: Secondary | ICD-10-CM | POA: Diagnosis not present

## 2022-07-10 DIAGNOSIS — J45909 Unspecified asthma, uncomplicated: Secondary | ICD-10-CM | POA: Diagnosis not present

## 2022-07-10 DIAGNOSIS — J302 Other seasonal allergic rhinitis: Secondary | ICD-10-CM | POA: Diagnosis not present

## 2022-07-10 DIAGNOSIS — E782 Mixed hyperlipidemia: Secondary | ICD-10-CM | POA: Diagnosis not present

## 2022-07-10 DIAGNOSIS — E1165 Type 2 diabetes mellitus with hyperglycemia: Secondary | ICD-10-CM | POA: Diagnosis not present

## 2022-07-10 DIAGNOSIS — K219 Gastro-esophageal reflux disease without esophagitis: Secondary | ICD-10-CM | POA: Diagnosis not present

## 2022-07-12 ENCOUNTER — Telehealth: Payer: Self-pay | Admitting: Internal Medicine

## 2022-07-12 DIAGNOSIS — J4530 Mild persistent asthma, uncomplicated: Secondary | ICD-10-CM

## 2022-07-13 MED ORDER — COMPRESSOR NEBULIZER MISC: 0 refills | Status: AC

## 2022-07-13 NOTE — Telephone Encounter (Signed)
Spoke with the pt  She states that she is needing new neb machine  Her machine is broken  She wants this sent to [facility]  I have sent rx  Nothing further needed

## 2022-07-17 DIAGNOSIS — J302 Other seasonal allergic rhinitis: Secondary | ICD-10-CM | POA: Diagnosis not present

## 2022-07-17 DIAGNOSIS — E782 Mixed hyperlipidemia: Secondary | ICD-10-CM | POA: Diagnosis not present

## 2022-07-17 DIAGNOSIS — I10 Essential (primary) hypertension: Secondary | ICD-10-CM | POA: Diagnosis not present

## 2022-07-17 DIAGNOSIS — K219 Gastro-esophageal reflux disease without esophagitis: Secondary | ICD-10-CM | POA: Diagnosis not present

## 2022-07-17 DIAGNOSIS — J45909 Unspecified asthma, uncomplicated: Secondary | ICD-10-CM | POA: Diagnosis not present

## 2022-07-17 DIAGNOSIS — E1165 Type 2 diabetes mellitus with hyperglycemia: Secondary | ICD-10-CM | POA: Diagnosis not present

## 2022-08-02 ENCOUNTER — Other Ambulatory Visit: Payer: Self-pay

## 2022-08-02 ENCOUNTER — Emergency Department (HOSPITAL_BASED_OUTPATIENT_CLINIC_OR_DEPARTMENT_OTHER)
Admission: EM | Admit: 2022-08-02 | Discharge: 2022-08-02 | Disposition: A | Discharge status: Home / Self Care | Payer: Medicare HMO | Attending: Emergency Medicine | Admitting: Emergency Medicine

## 2022-08-02 DIAGNOSIS — Z7984 Long term (current) use of oral hypoglycemic drugs: Secondary | ICD-10-CM | POA: Diagnosis not present

## 2022-08-02 DIAGNOSIS — Z7982 Long term (current) use of aspirin: Secondary | ICD-10-CM | POA: Insufficient documentation

## 2022-08-02 DIAGNOSIS — E782 Mixed hyperlipidemia: Secondary | ICD-10-CM | POA: Diagnosis not present

## 2022-08-02 DIAGNOSIS — J302 Other seasonal allergic rhinitis: Secondary | ICD-10-CM | POA: Diagnosis not present

## 2022-08-02 DIAGNOSIS — Z87891 Personal history of nicotine dependence: Secondary | ICD-10-CM | POA: Insufficient documentation

## 2022-08-02 DIAGNOSIS — K219 Gastro-esophageal reflux disease without esophagitis: Secondary | ICD-10-CM | POA: Diagnosis not present

## 2022-08-02 DIAGNOSIS — R197 Diarrhea, unspecified: Secondary | ICD-10-CM | POA: Diagnosis not present

## 2022-08-02 DIAGNOSIS — I10 Essential (primary) hypertension: Secondary | ICD-10-CM | POA: Diagnosis not present

## 2022-08-02 DIAGNOSIS — Z79899 Other long term (current) drug therapy: Secondary | ICD-10-CM | POA: Insufficient documentation

## 2022-08-02 DIAGNOSIS — E1165 Type 2 diabetes mellitus with hyperglycemia: Secondary | ICD-10-CM | POA: Diagnosis not present

## 2022-08-02 DIAGNOSIS — Z794 Long term (current) use of insulin: Secondary | ICD-10-CM | POA: Diagnosis not present

## 2022-08-02 DIAGNOSIS — E119 Type 2 diabetes mellitus without complications: Secondary | ICD-10-CM | POA: Insufficient documentation

## 2022-08-02 DIAGNOSIS — Z7951 Long term (current) use of inhaled steroids: Secondary | ICD-10-CM | POA: Insufficient documentation

## 2022-08-02 DIAGNOSIS — J45909 Unspecified asthma, uncomplicated: Secondary | ICD-10-CM | POA: Diagnosis not present

## 2022-08-02 DIAGNOSIS — K529 Noninfective gastroenteritis and colitis, unspecified: Secondary | ICD-10-CM | POA: Insufficient documentation

## 2022-08-02 DIAGNOSIS — R112 Nausea with vomiting, unspecified: Secondary | ICD-10-CM | POA: Diagnosis not present

## 2022-08-02 LAB — URINALYSIS, ROUTINE W REFLEX MICROSCOPIC
Bacteria, UA: NONE SEEN
Bilirubin Urine: NEGATIVE
Glucose, UA: >1000 mg/dL — AB
Hgb urine dipstick: NEGATIVE
Ketones, ur: NEGATIVE mg/dL
Leukocytes,Ua: NEGATIVE
Nitrite: NEGATIVE
Specific Gravity, Urine: 1.03 (ref 1.005–1.030)
pH: 5.5 (ref 5.0–8.0)

## 2022-08-02 LAB — COMPREHENSIVE METABOLIC PANEL
ALT: 14 U/L (ref 0–44)
AST: 12 U/L — ABNORMAL LOW (ref 15–41)
Albumin: 4.3 g/dL (ref 3.5–5.0)
Alkaline Phosphatase: 45 U/L (ref 38–126)
Anion gap: 9 (ref 5–15)
BUN: 22 mg/dL — ABNORMAL HIGH (ref 6–20)
CO2: 25 mmol/L (ref 22–32)
Calcium: 9.7 mg/dL (ref 8.9–10.3)
Chloride: 100 mmol/L (ref 98–111)
Creatinine, Ser: 0.98 mg/dL (ref 0.44–1.00)
GFR, Estimated: >60 mL/min (ref >60)
Glucose, Bld: 213 mg/dL — ABNORMAL HIGH (ref 70–99)
Potassium: 4.8 mmol/L (ref 3.5–5.1)
Sodium: 134 mmol/L — ABNORMAL LOW (ref 135–145)
Total Bilirubin: 0.5 mg/dL (ref 0.3–1.2)
Total Protein: 7.7 g/dL (ref 6.5–8.1)

## 2022-08-02 LAB — CBC
HCT: 39.3 % (ref 36.0–46.0)
Hemoglobin: 12.5 g/dL (ref 12.0–15.0)
MCH: 27.1 pg (ref 26.0–34.0)
MCHC: 31.8 g/dL (ref 30.0–36.0)
MCV: 85.2 fL (ref 80.0–100.0)
Platelets: 342 10*3/uL (ref 150–400)
RBC: 4.61 MIL/uL (ref 3.87–5.11)
RDW: 13.2 % (ref 11.5–15.5)
WBC: 9.1 10*3/uL (ref 4.0–10.5)
nRBC: 0 % (ref 0.0–0.2)

## 2022-08-02 LAB — CBG MONITORING, ED: Glucose-Capillary: 198 mg/dL — ABNORMAL HIGH (ref 70–99)

## 2022-08-02 LAB — LIPASE, BLOOD: Lipase: 10 U/L — ABNORMAL LOW (ref 11–51)

## 2022-08-02 MED ORDER — ONDANSETRON 8 MG PO TBDP: 8.0000 mg | ORAL_TABLET | Freq: Three times a day (TID) | ORAL | 0 refills | Status: DC | PRN

## 2022-08-02 MED ORDER — ONDANSETRON 4 MG PO TBDP
8.0000 mg | ORAL_TABLET | Freq: Once | ORAL | Status: AC
  Administered 2022-08-02: 8 mg via ORAL
  Filled 2022-08-02: qty 2

## 2022-08-02 MED ORDER — LOPERAMIDE HCL 2 MG PO CAPS
4.0000 mg | ORAL_CAPSULE | Freq: Once | ORAL | Status: AC
  Administered 2022-08-02: 4 mg via ORAL
  Filled 2022-08-02: qty 2

## 2022-08-02 NOTE — ED Triage Notes (Signed)
Pt complains of vomiting and diarrhea x 4 days. Pt states today no vomiting but has had diarrhea. Denies fever.

## 2022-08-02 NOTE — ED Notes (Signed)
Pt agreeable with d/c plan as discussed by provider- this nurse has verbally reinforced d/c instructions and provided pt with written copy.  Pt acknowledges verbal understanding and denies any addl questions, concerns, needs - pt ambulatory independently at d/c with steady gait accompanied by spouse  - no acute changes/distress

## 2022-08-02 NOTE — ED Notes (Signed)
Pt ambulatory from waiting room escorted by ED charge nurse and visitor- pt not observed to be in obvious distress.

## 2022-08-02 NOTE — ED Provider Notes (Signed)
[facility] Provider Note: Georgena Spurling, MD, FACEP  CSN: [id number] MRN: [id number] ARRIVAL: 08/02/22 at [place]: [id number]   CHIEF COMPLAINT  Abdominal Pain and Vomiting   HISTORY OF PRESENT ILLNESS  08/02/22 10:49 PM Leslie Scott is a 56 y.o. female with 4 days of nausea, vomiting and diarrhea.  The vomiting abated today but she continues to have diarrhea.  There is no blood in the diarrhea.  She is also having some pain and tenderness in the epigastrium which began after the vomiting started.  She rates his pain as a 4 out of 10, worse with palpation.  She denies having a fever.     Past Medical History:  Diagnosis Date   Adenotonsillar hypertrophy    Arthritis    shoulders   GERD (gastroesophageal reflux disease)    Hypertension    Mild persistent asthma    pulmologist-  dr wert   OSA (obstructive sleep apnea)    per pt no cpap   Palpitations    SUI (stress urinary incontinence, female)    Type 2 diabetes mellitus treated with insulin ([place])    followed by pcp    Past Surgical History:  Procedure Laterality Date   ABDOMINOPLASTY  2017   w/ Lipo   BREAST ENHANCEMENT SURGERY Bilateral 2001   and Abdominoplasty   CATARACT EXTRACTION W/ INTRAOCULAR LENS  IMPLANT, BILATERAL  2013   CYSTOSCOPY N/A 01/27/2018   Procedure: CYSTOSCOPY;  Surgeon: Franchot Gallo, MD;  Location: [facility];  Service: Urology;  Laterality: N/A;   PUBOVAGINAL SLING N/A 01/27/2018   Procedure: Gaynelle Arabian, Janyth Pupa;  Surgeon: Franchot Gallo, MD;  Location: [facility];  Service: Urology;  Laterality: N/A;   TONSILLECTOMY AND ADENOIDECTOMY Bilateral 03/03/2019   Procedure: TONSILLECTOMY AND ADENOIDECTOMY;  Surgeon: Leta Baptist, MD;  Location: [facility];  Service: ENT;  Laterality: Bilateral;    Family History  Problem Relation Age of Onset   D[place]ic[place] cancer Maternal Aunt    Allergies Mother    Asthma Mother     Social  History   Tobacco Use   Smoking status: Former    Packs/day: 0.50    Years: 20.00    Total pack years: 10.00    Types: Cigarettes    Quit date: 08/06/2005    Years since quitting: 17.0   Smokeless tobacco: Never  Vaping Use   Vaping Use: Never used  Substance Use Topics   Alcohol use: Yes    Alcohol/week: 0.0 standard drinks of alcohol    Comment: OCCASIONAL   Drug use: No    Prior to Admission medications   Medication Sig Start Date End Date Taking? Authorizing Provider  ondansetron (ZOFRAN-ODT) 8 MG disintegrating tablet Take 1 tablet (8 mg total) by mouth every 8 (eight) hours as needed for nausea or vomiting. 08/02/22  Yes Ammara Raj, MD  benzonatate (TESSALON) 200 MG capsule Take 1 capsule (200 mg total) by mouth 3 (three) times daily as needed for cough. 05/22/22   Tanda Rockers, MD  budesonide-formoterol (SYMBICORT) 80-4.5 MCG/ACT inhaler Take 2 puffs first thing in am and then another 2 puffs about 12 hours later. 05/22/22   Tanda Rockers, MD  famotidine (PEPCID) 20 MG tablet Take 1 tablet (20 mg total) by mouth 2 (two) times daily. 03/16/18   Volanda Napoleon, PA-C  insulin glargine (LANTUS) 100 UNIT/ML injection Inject 26 Units into the skin 2 (two) times daily.    [provider]  losartan (COZAAR) 25 MG tablet Take 25 mg by mouth every morning.     [provider]  metoprolol succinate (TOPROL-XL) 50 MG 24 hr tablet Take 50 mg by mouth every morning.  09/02/14   [provider]  Nebulizers (COMPRESSOR NEBULIZER) MISC Use as directed 07/13/22   Tanda Rockers, MD  omeprazole (PRILOSEC) 40 MG capsule Take 40 mg by mouth daily.    [provider]  sitaGLIPtin-metformin (JANUMET) 50-1000 MG per tablet Take 1 tablet by mouth 2 (two) times daily.  06/17/13   [provider]  VENTOLIN HFA 108 (90 Base) MCG/ACT inhaler INHALE 2 PUFFS INTO THE LUNGS EVERY 6 (SIX) HOURS AS NEEDED FOR WHEEZING OR SHORTNESS OF BREATH. 07/16/16   Tanda Rockers, MD    Allergies Morphine and related and Shellfish allergy   REVIEW OF SYSTEMS  Negative except as noted here or in the History of Present Illness.   PHYSICAL EXAMINATION  Initial Vital Signs Blood pressure 117/80, pulse 87, temperature 99.1 F (37.3 C), temperature source Oral, resp. rate 16, height '5\' 1"'$  (1.549 m), weight 73.5 kg, SpO2 98 %.  Examination General: Well-developed, well-nourished female in no acute distress; appearance consistent with age of record HENT: normocephalic; atraumatic Eyes: Normal appearance Neck: supple Heart: regular rate and rhythm Lungs: clear to auscultation bilaterally Abdomen: soft; nondistended; epigastric tenderness; bowel sounds present Extremities: No deformity; full range of motion; pulses normal Neurologic: Awake, alert and oriented; motor function intact in all extremities and symmetric; no facial droop Skin: Warm and dry Psychiatric: Normal mood and affect   RESULTS  Summary of this visit's results, reviewed and interpreted by myself:   EKG Interpretation  Date/Time:    Ventricular Rate:    PR Interval:    QRS Duration:   QT Interval:    QTC Calculation:   R Axis:     Text Interpretation:         Laboratory Studies: Results for orders placed or performed during the hospital encounter of 08/02/22 (from the past 24 hour(s))  CBG monitoring, ED     Status: Abnormal   Collection Time: 08/02/22  4:49 PM  Result Value Ref Range   Glucose-Capillary 198 (H) 70 - 99 mg/dL  Lipase, blood     Status: Abnormal   Collection Time: 08/02/22  4:54 PM  Result Value Ref Range   Lipase 10 (L) 11 - 51 U/L  Comprehensive metabolic panel     Status: Abnormal   Collection Time: 08/02/22  4:54 PM  Result Value Ref Range   Sodium 134 (L) 135 - 145 mmol/L   Potassium 4.8 3.5 - 5.1 mmol/L   Chloride 100 98 - 111 mmol/L   CO2 25 22 - 32 mmol/L   Glucose, Bld 213 (H) 70 - 99 mg/dL   BUN 22 (H) 6 - 20 mg/dL   Creatinine, Ser  0.98 0.44 - 1.00 mg/dL   Calcium 9.7 8.9 - 10.3 mg/dL   Total Protein 7.7 6.5 - 8.1 g/dL   Albumin 4.3 3.5 - 5.0 g/dL   AST 12 (L) 15 - 41 U/L   ALT 14 0 - 44 U/L   Alkaline Phosphatase 45 38 - 126 U/L   Total Bilirubin 0.5 0.3 - 1.2 mg/dL   GFR, Estimated >60 >60 mL/min   Anion gap 9 5 - 15  CBC     Status: None   Collection Time: 08/02/22  4:54 PM  Result Value Ref Range  WBC 9.1 4.0 - 10.5 K/uL   RBC 4.61 3.87 - 5.11 MIL/uL   Hemoglobin 12.5 12.0 - 15.0 g/dL   HCT 39.3 36.0 - 46.0 %   MCV 85.2 80.0 - 100.0 fL   MCH 27.1 26.0 - 34.0 pg   MCHC 31.8 30.0 - 36.0 g/dL   RDW 13.2 11.5 - 15.5 %   Platelets 342 150 - 400 K/uL   nRBC 0.0 0.0 - 0.2 %  Urinalysis, Routine w reflex microscopic     Status: Abnormal   Collection Time: 08/02/22  4:54 PM  Result Value Ref Range   Color, Urine YELLOW YELLOW   APPearance CLEAR CLEAR   Specific Gravity, Urine 1.030 1.005 - 1.030   pH 5.5 5.0 - 8.0   Glucose, UA >1,000 (A) NEGATIVE mg/dL   Hgb urine dipstick NEGATIVE NEGATIVE   Bilirubin Urine NEGATIVE NEGATIVE   Ketones, ur NEGATIVE NEGATIVE mg/dL   Protein, ur TRACE (A) NEGATIVE mg/dL   Nitrite NEGATIVE NEGATIVE   Leukocytes,Ua NEGATIVE NEGATIVE   RBC / HPF 0-5 0 - 5 RBC/hpf   WBC, UA 0-5 0 - 5 WBC/hpf   Bacteria, UA NONE SEEN NONE SEEN   Squamous Epithelial / LPF 6-10 0 - 5 /HPF   Mucus PRESENT    Imaging Studies: No results found.  ED COURSE and MDM  Nursing notes, initial and subsequent vitals signs, including pulse oximetry, reviewed and interpreted by myself.  Vitals:   08/02/22 1647 08/02/22 1648 08/02/22 2106  BP:  137/86 117/80  Pulse:  89 87  Resp:  18 16  Temp:  98.4 F (36.9 C) 99.1 F (37.3 C)  TempSrc:  Oral Oral  SpO2:  99% 98%  Weight: 73.5 kg    Height: '5\' 1"'$  (1.549 m)     Medications  ondansetron (ZOFRAN-ODT) disintegrating tablet 8 mg (has no administration in time range)  loperamide (IMODIUM) capsule 4 mg (has no administration in time range)    The patient's presentation is consistent with a viral gastroenteritis.  She does not appear to be in diabetic ketoacidosis.  The pain in her epigastrium seems superficial and I suspect it represents abdominal wall pain from the forceful vomiting.  The patient declines any intervention in the ED and would like to be discharged home at this time.  PROCEDURES  Procedures   ED DIAGNOSES     ICD-10-CM   1. Gastroenteritis  K52.9          Abie Cheek, MD 08/02/22 2259

## 2022-08-16 DIAGNOSIS — K219 Gastro-esophageal reflux disease without esophagitis: Secondary | ICD-10-CM | POA: Diagnosis not present

## 2022-08-16 DIAGNOSIS — I10 Essential (primary) hypertension: Secondary | ICD-10-CM | POA: Diagnosis not present

## 2022-08-16 DIAGNOSIS — J45909 Unspecified asthma, uncomplicated: Secondary | ICD-10-CM | POA: Diagnosis not present

## 2022-08-16 DIAGNOSIS — E782 Mixed hyperlipidemia: Secondary | ICD-10-CM | POA: Diagnosis not present

## 2022-08-16 DIAGNOSIS — E1165 Type 2 diabetes mellitus with hyperglycemia: Secondary | ICD-10-CM | POA: Diagnosis not present

## 2022-08-16 DIAGNOSIS — J302 Other seasonal allergic rhinitis: Secondary | ICD-10-CM | POA: Diagnosis not present

## 2022-09-25 DIAGNOSIS — Z794 Long term (current) use of insulin: Secondary | ICD-10-CM | POA: Diagnosis not present

## 2022-09-25 DIAGNOSIS — Z7984 Long term (current) use of oral hypoglycemic drugs: Secondary | ICD-10-CM | POA: Diagnosis not present

## 2022-09-25 DIAGNOSIS — E1165 Type 2 diabetes mellitus with hyperglycemia: Secondary | ICD-10-CM | POA: Diagnosis not present

## 2022-10-04 ENCOUNTER — Encounter (INDEPENDENT_AMBULATORY_CARE_PROVIDER_SITE_OTHER): Payer: Medicare HMO | Admitting: Ophthalmology

## 2022-10-04 DIAGNOSIS — E113493 Type 2 diabetes mellitus with severe nonproliferative diabetic retinopathy without macular edema, bilateral: Secondary | ICD-10-CM | POA: Diagnosis not present

## 2022-10-04 DIAGNOSIS — H43823 Vitreomacular adhesion, bilateral: Secondary | ICD-10-CM | POA: Diagnosis not present

## 2022-10-07 ENCOUNTER — Emergency Department (HOSPITAL_BASED_OUTPATIENT_CLINIC_OR_DEPARTMENT_OTHER)
Admission: EM | Admit: 2022-10-07 | Discharge: 2022-10-07 | Disposition: A | Discharge status: Home / Self Care | Payer: Medicare HMO | Attending: Emergency Medicine | Admitting: Emergency Medicine

## 2022-10-07 ENCOUNTER — Other Ambulatory Visit: Payer: Self-pay

## 2022-10-07 ENCOUNTER — Encounter (HOSPITAL_BASED_OUTPATIENT_CLINIC_OR_DEPARTMENT_OTHER): Payer: Self-pay | Admitting: Emergency Medicine

## 2022-10-07 ENCOUNTER — Emergency Department (HOSPITAL_BASED_OUTPATIENT_CLINIC_OR_DEPARTMENT_OTHER): Payer: Medicare HMO | Admitting: Radiology

## 2022-10-07 ENCOUNTER — Emergency Department (HOSPITAL_BASED_OUTPATIENT_CLINIC_OR_DEPARTMENT_OTHER): Payer: Medicare HMO

## 2022-10-07 DIAGNOSIS — I10 Essential (primary) hypertension: Secondary | ICD-10-CM | POA: Insufficient documentation

## 2022-10-07 DIAGNOSIS — M7918 Myalgia, other site: Secondary | ICD-10-CM | POA: Diagnosis not present

## 2022-10-07 DIAGNOSIS — R42 Dizziness and giddiness: Secondary | ICD-10-CM | POA: Diagnosis not present

## 2022-10-07 DIAGNOSIS — S199XXA Unspecified injury of neck, initial encounter: Secondary | ICD-10-CM | POA: Diagnosis not present

## 2022-10-07 DIAGNOSIS — M542 Cervicalgia: Secondary | ICD-10-CM | POA: Insufficient documentation

## 2022-10-07 DIAGNOSIS — E119 Type 2 diabetes mellitus without complications: Secondary | ICD-10-CM | POA: Diagnosis not present

## 2022-10-07 DIAGNOSIS — Z794 Long term (current) use of insulin: Secondary | ICD-10-CM | POA: Diagnosis not present

## 2022-10-07 DIAGNOSIS — M25562 Pain in left knee: Secondary | ICD-10-CM | POA: Diagnosis not present

## 2022-10-07 DIAGNOSIS — J45909 Unspecified asthma, uncomplicated: Secondary | ICD-10-CM | POA: Diagnosis not present

## 2022-10-07 DIAGNOSIS — R936 Abnormal findings on diagnostic imaging of limbs: Secondary | ICD-10-CM | POA: Diagnosis not present

## 2022-10-07 DIAGNOSIS — Z79899 Other long term (current) drug therapy: Secondary | ICD-10-CM | POA: Diagnosis not present

## 2022-10-07 DIAGNOSIS — S8992XA Unspecified injury of left lower leg, initial encounter: Secondary | ICD-10-CM | POA: Diagnosis not present

## 2022-10-07 DIAGNOSIS — Y92410 Unspecified street and highway as the place of occurrence of the external cause: Secondary | ICD-10-CM | POA: Diagnosis not present

## 2022-10-07 MED ORDER — CYCLOBENZAPRINE HCL 10 MG PO TABS: 10.0000 mg | ORAL_TABLET | Freq: Two times a day (BID) | ORAL | 0 refills | Status: DC | PRN

## 2022-10-07 MED ORDER — ACETAMINOPHEN 500 MG PO TABS
1000.0000 mg | ORAL_TABLET | Freq: Once | ORAL | Status: AC
  Administered 2022-10-07: 1000 mg via ORAL
  Filled 2022-10-07: qty 2
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
## 2022-10-15 DIAGNOSIS — E1165 Type 2 diabetes mellitus with hyperglycemia: Secondary | ICD-10-CM | POA: Diagnosis not present

## 2022-10-17 ENCOUNTER — Telehealth: Payer: Self-pay | Admitting: *Deleted
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
## 2022-10-26 DIAGNOSIS — M25511 Pain in right shoulder: Secondary | ICD-10-CM | POA: Diagnosis not present

## 2022-11-15 DIAGNOSIS — Z0000 Encounter for general adult medical examination without abnormal findings: Secondary | ICD-10-CM | POA: Diagnosis not present

## 2022-11-15 DIAGNOSIS — E782 Mixed hyperlipidemia: Secondary | ICD-10-CM | POA: Diagnosis not present

## 2022-11-15 DIAGNOSIS — I10 Essential (primary) hypertension: Secondary | ICD-10-CM | POA: Diagnosis not present

## 2022-11-15 DIAGNOSIS — J302 Other seasonal allergic rhinitis: Secondary | ICD-10-CM | POA: Diagnosis not present

## 2022-11-15 DIAGNOSIS — E1165 Type 2 diabetes mellitus with hyperglycemia: Secondary | ICD-10-CM | POA: Diagnosis not present

## 2022-11-15 DIAGNOSIS — J45909 Unspecified asthma, uncomplicated: Secondary | ICD-10-CM | POA: Diagnosis not present

## 2022-11-15 DIAGNOSIS — K219 Gastro-esophageal reflux disease without esophagitis: Secondary | ICD-10-CM | POA: Diagnosis not present

## 2022-11-20 ENCOUNTER — Ambulatory Visit: Payer: Medicare HMO | Admitting: Internal Medicine

## 2022-11-20 DIAGNOSIS — E1165 Type 2 diabetes mellitus with hyperglycemia: Secondary | ICD-10-CM | POA: Diagnosis not present

## 2022-11-20 DIAGNOSIS — Z794 Long term (current) use of insulin: Secondary | ICD-10-CM | POA: Diagnosis not present

## 2022-11-23 DIAGNOSIS — M25511 Pain in right shoulder: Secondary | ICD-10-CM | POA: Diagnosis not present

## 2022-12-06 DIAGNOSIS — E113293 Type 2 diabetes mellitus with mild nonproliferative diabetic retinopathy without macular edema, bilateral: Secondary | ICD-10-CM | POA: Diagnosis not present

## 2022-12-06 DIAGNOSIS — Z0100 Encounter for examination of eyes and vision without abnormal findings: Secondary | ICD-10-CM | POA: Diagnosis not present

## 2022-12-07 DIAGNOSIS — Z0100 Encounter for examination of eyes and vision without abnormal findings: Secondary | ICD-10-CM | POA: Diagnosis not present

## 2022-12-13 DIAGNOSIS — E113493 Type 2 diabetes mellitus with severe nonproliferative diabetic retinopathy without macular edema, bilateral: Secondary | ICD-10-CM | POA: Diagnosis not present

## 2022-12-13 DIAGNOSIS — H43823 Vitreomacular adhesion, bilateral: Secondary | ICD-10-CM | POA: Diagnosis not present

## 2022-12-28 DIAGNOSIS — M25511 Pain in right shoulder: Secondary | ICD-10-CM | POA: Diagnosis not present

## 2022-12-28 DIAGNOSIS — M25562 Pain in left knee: Secondary | ICD-10-CM | POA: Diagnosis not present

## 2022-12-30 NOTE — Progress Notes (Deleted)
Subjective:    Patient ID: Leslie Scott, female    DOB: Jan 26, 1966    MRN: [id number]    Brief patient profile:  57 yo PR former smoker  with asthma since age 73 on Theophylline ever since better in 2006 breathing while in [place] and reduction to may be once a day but breathing worse in 2014 despite quit smoking 2007 [place] 2013 and referred to pulmonary clinic 08/12/13 by Dr Amada Kingfisher bonsu for eval of lung nodule      History of Present Illness  08/12/2014 1st [facility] office visit/ Sherene Sires  / maintained on advair and theoph Chief Complaint  Patient presents with   Pulmonary Consult    Referred by Dr. Julio Sicks for eval of pulmonary nodule. Pt states that she was dxed with asthma at age 27. She c/o increased SOB for the past yr.  She states that she gets SOB with "any rapid movement".    last pred x 6 months/ still on theoph with recent dx of gerd at [facility]/ has chest tightness better with saba and doe x anything more than slow adls x years rec No more CT scans for 6 months  Stop theophylline and advair  Dulera 200 Take 2 puffs first thing in am and then another 2 puffs about 12 hours later and if happy with it then fill the Rx  Work on inhaler technique:  relax and gently blow all the way out then take a nice smooth deep breath back in, triggering the inhaler at same time you start breathing in.  Hold for up to 5 seconds if you can.  Rinse and gargle with water when done Only use your albuterol(proair)  as a rescue medication Nexium 40 mg Take 30- 60 min before your first and last meals of the day  GERD  Diet       05/22/2022  f/u ov/Iver Miklas re: asthma maint on symbicort 80 2bid  did bring meds  Chief Complaint  Patient presents with   Follow-up    No new issues since LOV  Dyspnea:  still  not back to normal eg having trouble finishing [employer] s resting  Cough: none  Sleeping: flat bed / 1-2 pillows on side  SABA use: 2 x per week 02: none  Rec Try prilosec otc 20mg    Take 30-60 min before first meal of the day and Pepcid ac (famotidine) 20 mg one @  bedtime until cough is completely gone Plan A = Automatic = Always=    Symbicort 80 Take 2 puffs first thing in am and then another 2 puffs about 12 hours later.   Plan B = Backup (to supplement plan A, not to replace it) Only use your albuterol inhaler as a rescue medication Plan C = Crisis (instead of Plan B but only if Plan B stops working) - only use your albuterol nebulizer if you first try Plan B  For cough  >  tessalon 200 mg every 4-6 hours     Please schedule a follow up visit in 6 months but call sooner if needed  with all medications /inhalers/ solutions in hand   01/01/2023  f/u ov/Rylend Pietrzak re: asthma    maint on *** did *** bring meds No chief complaint on file.   Dyspnea:  *** Cough: *** Sleeping: *** SABA use: *** 02: *** Covid status:   *** Lung cancer screening :  ***    No obvious day to day or daytime  variability or assoc excess/ purulent sputum or mucus plugs or hemoptysis or cp or chest tightness, subjective wheeze or overt sinus or hb symptoms.   *** without nocturnal  or early am exacerbation  of respiratory  c/o's or need for noct saba. Also denies any obvious fluctuation of symptoms with weather or environmental changes or other aggravating or alleviating factors except as outlined above   No unusual exposure hx or h/o childhood pna/ asthma or knowledge of premature birth.  Current Allergies, Complete Past Medical History, Past Surgical History, Family History, and Social History were reviewed in [facility] record.  ROS  The following are not active complaints unless bolded Hoarseness, sore throat, dysphagia, dental problems, itching, sneezing,  nasal congestion or discharge of excess mucus or purulent secretions, ear ache,   fever, chills, sweats, unintended wt loss or wt gain, classically pleuritic or exertional cp,  orthopnea pnd or arm/hand swelling  or  leg swelling, presyncope, palpitations, abdominal pain, anorexia, nausea, vomiting, diarrhea  or change in bowel habits or change in bladder habits, change in stools or change in urine, dysuria, hematuria,  rash, arthralgias, visual complaints, headache, numbness, weakness or ataxia or problems with walking or coordination,  change in mood or  memory.        No outpatient medications have been marked as taking for the 01/01/23 encounter (Appointment) with Nyoka Cowden, MD.                  Objective:   Physical Exam  Wts  01/01/2023         ***  05/22/2022       161  04/23/2022        165   09/24/2014        184  > 08/18/2015 180 > 02/24/2016  172     08/12/14 175 lb (79.379 kg)  03/31/14 170 lb (77.111 kg)  03/30/14 170 lb (77.111 kg)     Vital signs reviewed  01/01/2023  - Note at rest 02 sats  ***% on ***   General appearance:    ***    Assessment & Plan:

## 2023-01-01 ENCOUNTER — Ambulatory Visit: Payer: Medicare HMO | Admitting: Internal Medicine

## 2023-01-02 DIAGNOSIS — M25511 Pain in right shoulder: Secondary | ICD-10-CM | POA: Diagnosis not present

## 2023-01-10 DIAGNOSIS — M25511 Pain in right shoulder: Secondary | ICD-10-CM | POA: Diagnosis not present

## 2023-01-11 DIAGNOSIS — Z0001 Encounter for general adult medical examination with abnormal findings: Secondary | ICD-10-CM | POA: Diagnosis not present

## 2023-01-11 DIAGNOSIS — K219 Gastro-esophageal reflux disease without esophagitis: Secondary | ICD-10-CM | POA: Diagnosis not present

## 2023-01-11 DIAGNOSIS — E1165 Type 2 diabetes mellitus with hyperglycemia: Secondary | ICD-10-CM | POA: Diagnosis not present

## 2023-01-11 DIAGNOSIS — J302 Other seasonal allergic rhinitis: Secondary | ICD-10-CM | POA: Diagnosis not present

## 2023-01-11 DIAGNOSIS — I10 Essential (primary) hypertension: Secondary | ICD-10-CM | POA: Diagnosis not present

## 2023-01-11 DIAGNOSIS — E782 Mixed hyperlipidemia: Secondary | ICD-10-CM | POA: Diagnosis not present

## 2023-01-11 DIAGNOSIS — J45909 Unspecified asthma, uncomplicated: Secondary | ICD-10-CM | POA: Diagnosis not present

## 2023-01-14 DIAGNOSIS — S46011A Strain of muscle(s) and tendon(s) of the rotator cuff of right shoulder, initial encounter: Secondary | ICD-10-CM | POA: Diagnosis not present

## 2023-01-21 DIAGNOSIS — E1165 Type 2 diabetes mellitus with hyperglycemia: Secondary | ICD-10-CM | POA: Diagnosis not present

## 2023-02-01 DIAGNOSIS — Z1231 Encounter for screening mammogram for malignant neoplasm of breast: Secondary | ICD-10-CM | POA: Diagnosis not present

## 2023-02-20 NOTE — Progress Notes (Signed)
Subjective:   Patient ID: Leslie Scott, female    DOB: October 15, 1965    MRN: [id number]    Brief patient profile:  57  yo [place] former smoker  with asthma since age 37 on Theophylline ever since better in 2006 breathing while in [place] and reduction to may be once a day but breathing worse in 2014 despite quit smoking 2007 [place] 2013 and referred to pulmonary clinic 08/12/13 by Dr Amada Kingfisher bonsu for eval of lung nodule      History of Present Illness  08/12/2014 1st [facility] office visit/ Leslie Scott  / maintained on advair and theoph Chief Complaint  Patient presents with   Pulmonary Consult    Referred by Dr. Julio Sicks for eval of pulmonary nodule. Pt states that she was dxed with asthma at age 89. She c/o increased SOB for the past yr.  She states that she gets SOB with "any rapid movement".    last pred x 6 months/ still on theoph with recent [facility] of gerd at [facility]/ has chest tightness better with saba and doe x anything more than slow adls x years rec No more CT scans for 6 months  Stop theophylline and advair  Dulera 200 Take 2 puffs first thing in am and then another 2 puffs about 12 hours later and if happy with it then fill the Rx  Work on inhaler technique:  relax and gently blow all the way out then take a nice smooth deep breath back in, triggering the inhaler at same time you start breathing in.  Hold for up to 5 seconds if you can.  Rinse and gargle with water when done Only use your albuterol(proair)  as a rescue medication Nexium 40 mg Take 30- 60 min before your first and last meals of the day  GERD  Diet     02/24/2016  f/u ov/Leslie Scott re: asthma/ happy with dulera 200 one twice  Daily (misunderstood instructions Chief Complaint  Patient presents with   Follow-up    Breathing is doing well on Dulera 100. She has not had to use albuterol inhaler or neb. No new co's today.   Not limited by breathing from desired activities  Rec Plan A = Automatic =  Dulera 100 Take 2  puffs first thing in am and then another 2 puffs about 12 hours later.  Work on inhaler technique:   Plan B = Backup Only use your albuterol (ventolin)  as a rescue medication  Plan C = Crisis - only use your albuterol nebulizer if you first try Plan B   Pulmonary follow up is as needed     05/22/2022  f/u ov/Leslie Scott re: asthma maint on symbicort 80 2bid  did bring meds  Chief Complaint  Patient presents with   Follow-up    No new issues since LOV  Dyspnea:  still  not back to normal eg having trouble finishing [employer] s resting  Cough: none  Sleeping: flat bed / 1-2 pillows on side  SABA use: 2 x per week 02: none   Rec Plan A = Automatic = Always=    Symbicort 80 Take 2 puffs first thing in am and then another 2 puffs about 12 hours later.   Plan B = Backup (to supplement plan A, not to replace it) Only use your albuterol inhaler as a rescue medication Plan C = Crisis (instead of Plan B but only if Plan B stops working) - only use your  albuterol nebulizer if you first try Plan B  For cough  >  tessalon 200 mg every 4-6 hours        02/21/2023  f/u ov/Leslie Scott re: asthma  maint on symbicort  did not bring meds - did not take symb am of ov Chief Complaint  Patient presents with   Follow-up    Wheezing   Dyspnea:  worse x several  weeks  Cough: not much at all  Sleeping: flat bed 2 pillows s resp cc SABA use: now waking up and needing saba  02: none      No obvious day to day or daytime variability or assoc excess/ purulent sputum or mucus plugs or hemoptysis or cp or chest tightness, subjective wheeze or overt sinus or hb symptoms.      Also denies any obvious fluctuation of symptoms with weather or environmental changes or other aggravating or alleviating factors except as outlined above   No unusual exposure hx or h/o childhood pna/ asthma or knowledge of premature birth.  Current Allergies, Complete Past Medical History, Past Surgical History, Family History, and Social  History were reviewed in [employer] record.  ROS  The following are not active complaints unless bolded Hoarseness, sore throat, dysphagia, dental problems, itching, sneezing,  nasal congestion or discharge of excess mucus or purulent secretions, ear ache,   fever, chills, sweats, unintended wt loss or wt gain, classically pleuritic or exertional cp,  orthopnea pnd or arm/hand swelling  or leg swelling, presyncope, palpitations, abdominal pain, anorexia, nausea, vomiting, diarrhea  or change in bowel habits or change in bladder habits, change in stools or change in urine, dysuria, hematuria,  rash, arthralgias, visual complaints, headache, numbness, weakness or ataxia or problems with walking or coordination,  change in mood or  memory.        Current Meds  Medication Sig   budesonide-formoterol (SYMBICORT) 80-4.5 MCG/ACT inhaler Take 2 puffs first thing in am and then another 2 puffs about 12 hours later.   cyclobenzaprine (FLEXERIL) 10 MG tablet Take 1 tablet (10 mg total) by mouth 2 (two) times daily as needed for muscle spasms.   famotidine (PEPCID) 20 MG tablet Take 1 tablet (20 mg total) by mouth 2 (two) times daily.   insulin glargine (LANTUS) 100 UNIT/ML injection Inject 26 Units into the skin 2 (two) times daily.   losartan (COZAAR) 25 MG tablet Take 25 mg by mouth every morning.    metoprolol succinate (TOPROL-XL) 50 MG 24 hr tablet Take 50 mg by mouth every morning.    Nebulizers (COMPRESSOR NEBULIZER) MISC Use as directed   omeprazole (PRILOSEC) 40 MG capsule Take 40 mg by mouth daily.   ondansetron (ZOFRAN-ODT) 8 MG disintegrating tablet Take 1 tablet (8 mg total) by mouth every 8 (eight) hours as needed for nausea or vomiting.   sitaGLIPtin-metformin (JANUMET) 50-1000 MG per tablet Take 1 tablet by mouth 2 (two) times daily.    VENTOLIN HFA 108 (90 Base) MCG/ACT inhaler INHALE 2 PUFFS INTO THE LUNGS EVERY 6 (SIX) HOURS AS NEEDED FOR WHEEZING OR SHORTNESS OF  BREATH.                 Objective:   Physical Exam  Wts 02/21/2023         163  05/22/2022      161  04/23/2022        165   09/24/2014        184  Vital signs reviewed  02/21/2023  - Note at rest 02 sats  96% on RA   General appearance:    amb pr female nad slt raspy voice texture/ pseudowheeze    HEENT : Oropharynx  clear       NECK :  without  apparent JVD/ palpable Nodes/TM    LUNGS: no acc muscle use,  Nl contour chest which is clear to A and P bilaterally without cough on insp or exp maneuvers   CV:  RRR  no s3 or murmur or increase in P2, and no edema   ABD:  soft and nontender    MS:  Nl gait/ ext warm without deformities Or obvious joint restrictions  calf tenderness, cyanosis or clubbing    SKIN: warm and dry without lesions    NEURO:  alert, approp, nl sensorium with  no motor or cerebellar deficits apparent.     Assessment & Plan:

## 2023-02-21 ENCOUNTER — Ambulatory Visit (INDEPENDENT_AMBULATORY_CARE_PROVIDER_SITE_OTHER): Payer: Medicare HMO | Admitting: Internal Medicine

## 2023-02-21 ENCOUNTER — Encounter: Payer: Self-pay | Admitting: Internal Medicine

## 2023-02-21 VITALS — BP 116/64 | HR 98 | Temp 98.8°F | Ht 61.0 in | Wt 163.0 lb

## 2023-02-21 DIAGNOSIS — J4530 Mild persistent asthma, uncomplicated: Secondary | ICD-10-CM

## 2023-02-21 NOTE — Patient Instructions (Signed)
Change symbicort to 160 and Take 2 puffs first thing in am and then another 2 puffs about 12 hours later.    When back to normal to go back to 80 especially if the higher strength bothers your throat   Work on inhaler technique:  relax and gently blow all the way out then take a nice smooth full deep breath back in, triggering the inhaler at same time you start breathing in.  Hold breath in for at least  5 seconds if you can. Blow out symbicort   thru nose. Rinse and gargle with water when done.  If mouth or throat bother you at all,  try brushing teeth/gums/tongue with arm and hammer toothpaste/ make a slurry and gargle and spit out.   >>>  Remember how golfers warm up by taking practice swings - do this with an empty inhaler    Please schedule a follow up visit in 3 months but call sooner if needed  with all medications /inhalers/ solutions in hand so we can verify exactly what you are taking. This includes all medications from all doctors and over the counters

## 2023-02-22 DIAGNOSIS — E1165 Type 2 diabetes mellitus with hyperglycemia: Secondary | ICD-10-CM | POA: Diagnosis not present

## 2023-02-22 DIAGNOSIS — K219 Gastro-esophageal reflux disease without esophagitis: Secondary | ICD-10-CM | POA: Diagnosis not present

## 2023-02-22 DIAGNOSIS — I10 Essential (primary) hypertension: Secondary | ICD-10-CM | POA: Diagnosis not present

## 2023-02-22 DIAGNOSIS — E782 Mixed hyperlipidemia: Secondary | ICD-10-CM | POA: Diagnosis not present

## 2023-02-22 DIAGNOSIS — J302 Other seasonal allergic rhinitis: Secondary | ICD-10-CM | POA: Diagnosis not present

## 2023-02-22 DIAGNOSIS — J45909 Unspecified asthma, uncomplicated: Secondary | ICD-10-CM | POA: Diagnosis not present

## 2023-02-22 NOTE — Assessment & Plan Note (Signed)
Onset childhood while in [place] rx theoph d/c'd  08/12/14 D - PFT's 09/24/14  wnl except for ERV 19% and non specific reduction in fef25-75  -08/18/2015    try step down to dulera 100 > took 200 one bid instead - 02/24/2016  After extensive coaching HFA effectiveness =    75% try dulea 100 2bid> did fine until covid 24 May 2020 despite non-adherence due to insurance restrictions - Aug 2023 restarted symb 80 prn but sill overusing hfa - 04/23/2022  After extensive coaching inhaler device,  effectiveness =    75% from a baseline of 50 % so rec symb 80 2bid (not prn) max gerd rx / tessalon 200 prn and and approp use of saba  - 02/21/2023  After extensive coaching inhaler device,  effectiveness =    60% (short ti) so try symb160 for mild flare then back to 80 to reduce upper airway irritation which should do better if she can master hfa   F/u 3 m sooner if needed   Each maintenance medication was reviewed in detail including emphasizing most importantly the difference between maintenance and prns and under what circumstances the prns are to be triggered using an action plan format where appropriate.  Total time for H and P, chart review, counseling, reviewing hfa device(s) and generating customized AVS unique to this office visit / same day charting = 22 min

## 2023-03-25 DIAGNOSIS — S46011D Strain of muscle(s) and tendon(s) of the rotator cuff of right shoulder, subsequent encounter: Secondary | ICD-10-CM | POA: Diagnosis not present

## 2023-03-27 DIAGNOSIS — Z7985 Long-term (current) use of injectable non-insulin antidiabetic drugs: Secondary | ICD-10-CM | POA: Diagnosis not present

## 2023-03-27 DIAGNOSIS — Z794 Long term (current) use of insulin: Secondary | ICD-10-CM | POA: Diagnosis not present

## 2023-03-27 DIAGNOSIS — Z978 Presence of other specified devices: Secondary | ICD-10-CM | POA: Diagnosis not present

## 2023-03-27 DIAGNOSIS — E1165 Type 2 diabetes mellitus with hyperglycemia: Secondary | ICD-10-CM | POA: Diagnosis not present

## 2023-04-15 DIAGNOSIS — E113412 Type 2 diabetes mellitus with severe nonproliferative diabetic retinopathy with macular edema, left eye: Secondary | ICD-10-CM | POA: Diagnosis not present

## 2023-04-15 DIAGNOSIS — E113491 Type 2 diabetes mellitus with severe nonproliferative diabetic retinopathy without macular edema, right eye: Secondary | ICD-10-CM | POA: Diagnosis not present

## 2023-04-15 DIAGNOSIS — H43823 Vitreomacular adhesion, bilateral: Secondary | ICD-10-CM | POA: Diagnosis not present

## 2023-04-19 ENCOUNTER — Encounter: Payer: Self-pay | Admitting: Internal Medicine

## 2023-04-21 DIAGNOSIS — E1165 Type 2 diabetes mellitus with hyperglycemia: Secondary | ICD-10-CM | POA: Diagnosis not present

## 2023-05-03 DIAGNOSIS — K219 Gastro-esophageal reflux disease without esophagitis: Secondary | ICD-10-CM | POA: Diagnosis not present

## 2023-05-03 DIAGNOSIS — E782 Mixed hyperlipidemia: Secondary | ICD-10-CM | POA: Diagnosis not present

## 2023-05-03 DIAGNOSIS — E1165 Type 2 diabetes mellitus with hyperglycemia: Secondary | ICD-10-CM | POA: Diagnosis not present

## 2023-05-03 DIAGNOSIS — J45909 Unspecified asthma, uncomplicated: Secondary | ICD-10-CM | POA: Diagnosis not present

## 2023-05-03 DIAGNOSIS — I10 Essential (primary) hypertension: Secondary | ICD-10-CM | POA: Diagnosis not present

## 2023-05-03 DIAGNOSIS — Z23 Encounter for immunization: Secondary | ICD-10-CM | POA: Diagnosis not present

## 2023-05-03 DIAGNOSIS — J302 Other seasonal allergic rhinitis: Secondary | ICD-10-CM | POA: Diagnosis not present

## 2023-05-12 ENCOUNTER — Other Ambulatory Visit: Payer: Self-pay | Admitting: Internal Medicine

## 2023-05-15 DIAGNOSIS — H11043 Peripheral pterygium, stationary, bilateral: Secondary | ICD-10-CM | POA: Diagnosis not present

## 2023-05-15 DIAGNOSIS — E113492 Type 2 diabetes mellitus with severe nonproliferative diabetic retinopathy without macular edema, left eye: Secondary | ICD-10-CM | POA: Diagnosis not present

## 2023-05-15 DIAGNOSIS — H35032 Hypertensive retinopathy, left eye: Secondary | ICD-10-CM | POA: Diagnosis not present

## 2023-05-15 DIAGNOSIS — Z961 Presence of intraocular lens: Secondary | ICD-10-CM | POA: Diagnosis not present

## 2023-05-15 DIAGNOSIS — H3563 Retinal hemorrhage, bilateral: Secondary | ICD-10-CM | POA: Diagnosis not present

## 2023-05-15 DIAGNOSIS — H169 Unspecified keratitis: Secondary | ICD-10-CM | POA: Diagnosis not present

## 2023-05-24 DIAGNOSIS — R1030 Lower abdominal pain, unspecified: Secondary | ICD-10-CM | POA: Diagnosis not present

## 2023-05-24 DIAGNOSIS — E1165 Type 2 diabetes mellitus with hyperglycemia: Secondary | ICD-10-CM | POA: Diagnosis not present

## 2023-05-24 DIAGNOSIS — K219 Gastro-esophageal reflux disease without esophagitis: Secondary | ICD-10-CM | POA: Diagnosis not present

## 2023-05-24 DIAGNOSIS — J302 Other seasonal allergic rhinitis: Secondary | ICD-10-CM | POA: Diagnosis not present

## 2023-05-24 DIAGNOSIS — J45909 Unspecified asthma, uncomplicated: Secondary | ICD-10-CM | POA: Diagnosis not present

## 2023-05-24 DIAGNOSIS — I10 Essential (primary) hypertension: Secondary | ICD-10-CM | POA: Diagnosis not present

## 2023-05-24 DIAGNOSIS — E782 Mixed hyperlipidemia: Secondary | ICD-10-CM | POA: Diagnosis not present

## 2023-05-27 ENCOUNTER — Ambulatory Visit: Payer: Medicare HMO | Admitting: Internal Medicine

## 2023-06-07 DIAGNOSIS — J302 Other seasonal allergic rhinitis: Secondary | ICD-10-CM | POA: Diagnosis not present

## 2023-06-07 DIAGNOSIS — K219 Gastro-esophageal reflux disease without esophagitis: Secondary | ICD-10-CM | POA: Diagnosis not present

## 2023-06-07 DIAGNOSIS — E782 Mixed hyperlipidemia: Secondary | ICD-10-CM | POA: Diagnosis not present

## 2023-06-07 DIAGNOSIS — I10 Essential (primary) hypertension: Secondary | ICD-10-CM | POA: Diagnosis not present

## 2023-06-07 DIAGNOSIS — R1030 Lower abdominal pain, unspecified: Secondary | ICD-10-CM | POA: Diagnosis not present

## 2023-06-07 DIAGNOSIS — R768 Other specified abnormal immunological findings in serum: Secondary | ICD-10-CM | POA: Diagnosis not present

## 2023-06-07 DIAGNOSIS — J45909 Unspecified asthma, uncomplicated: Secondary | ICD-10-CM | POA: Diagnosis not present

## 2023-06-07 DIAGNOSIS — E1165 Type 2 diabetes mellitus with hyperglycemia: Secondary | ICD-10-CM | POA: Diagnosis not present

## 2023-06-19 DIAGNOSIS — M25512 Pain in left shoulder: Secondary | ICD-10-CM | POA: Diagnosis not present

## 2023-06-21 DIAGNOSIS — I10 Essential (primary) hypertension: Secondary | ICD-10-CM | POA: Diagnosis not present

## 2023-06-21 DIAGNOSIS — E782 Mixed hyperlipidemia: Secondary | ICD-10-CM | POA: Diagnosis not present

## 2023-06-21 DIAGNOSIS — R1030 Lower abdominal pain, unspecified: Secondary | ICD-10-CM | POA: Diagnosis not present

## 2023-06-21 DIAGNOSIS — R768 Other specified abnormal immunological findings in serum: Secondary | ICD-10-CM | POA: Diagnosis not present

## 2023-06-21 DIAGNOSIS — J45909 Unspecified asthma, uncomplicated: Secondary | ICD-10-CM | POA: Diagnosis not present

## 2023-06-21 DIAGNOSIS — E1165 Type 2 diabetes mellitus with hyperglycemia: Secondary | ICD-10-CM | POA: Diagnosis not present

## 2023-06-21 DIAGNOSIS — J302 Other seasonal allergic rhinitis: Secondary | ICD-10-CM | POA: Diagnosis not present

## 2023-06-21 DIAGNOSIS — K219 Gastro-esophageal reflux disease without esophagitis: Secondary | ICD-10-CM | POA: Diagnosis not present

## 2023-06-24 DIAGNOSIS — R1033 Periumbilical pain: Secondary | ICD-10-CM | POA: Diagnosis not present

## 2023-06-24 DIAGNOSIS — R634 Abnormal weight loss: Secondary | ICD-10-CM | POA: Diagnosis not present

## 2023-06-24 DIAGNOSIS — R1013 Epigastric pain: Secondary | ICD-10-CM | POA: Diagnosis not present

## 2023-06-25 ENCOUNTER — Other Ambulatory Visit (HOSPITAL_COMMUNITY): Payer: Self-pay | Admitting: Gastroenterology

## 2023-06-25 DIAGNOSIS — R1013 Epigastric pain: Secondary | ICD-10-CM

## 2023-07-02 ENCOUNTER — Ambulatory Visit (HOSPITAL_COMMUNITY)
Admission: RE | Admit: 2023-07-02 | Discharge: 2023-07-02 | Disposition: A | Discharge status: Home / Self Care | Payer: Medicare HMO | Source: Ambulatory Visit | Attending: Gastroenterology | Admitting: Gastroenterology

## 2023-07-02 DIAGNOSIS — R1013 Epigastric pain: Secondary | ICD-10-CM | POA: Insufficient documentation

## 2023-07-03 DIAGNOSIS — R1013 Epigastric pain: Secondary | ICD-10-CM | POA: Diagnosis not present

## 2023-07-03 DIAGNOSIS — R634 Abnormal weight loss: Secondary | ICD-10-CM | POA: Diagnosis not present

## 2023-07-03 DIAGNOSIS — K319 Disease of stomach and duodenum, unspecified: Secondary | ICD-10-CM | POA: Diagnosis not present

## 2023-07-03 DIAGNOSIS — R1033 Periumbilical pain: Secondary | ICD-10-CM | POA: Diagnosis not present

## 2023-07-03 DIAGNOSIS — K3189 Other diseases of stomach and duodenum: Secondary | ICD-10-CM | POA: Diagnosis not present

## 2023-07-17 DIAGNOSIS — M25512 Pain in left shoulder: Secondary | ICD-10-CM | POA: Diagnosis not present

## 2023-07-19 DIAGNOSIS — E1165 Type 2 diabetes mellitus with hyperglycemia: Secondary | ICD-10-CM | POA: Diagnosis not present

## 2023-07-19 DIAGNOSIS — R1030 Lower abdominal pain, unspecified: Secondary | ICD-10-CM | POA: Diagnosis not present

## 2023-07-19 DIAGNOSIS — I10 Essential (primary) hypertension: Secondary | ICD-10-CM | POA: Diagnosis not present

## 2023-07-19 DIAGNOSIS — J45909 Unspecified asthma, uncomplicated: Secondary | ICD-10-CM | POA: Diagnosis not present

## 2023-07-19 DIAGNOSIS — K219 Gastro-esophageal reflux disease without esophagitis: Secondary | ICD-10-CM | POA: Diagnosis not present

## 2023-07-19 DIAGNOSIS — R768 Other specified abnormal immunological findings in serum: Secondary | ICD-10-CM | POA: Diagnosis not present

## 2023-07-19 DIAGNOSIS — E782 Mixed hyperlipidemia: Secondary | ICD-10-CM | POA: Diagnosis not present

## 2023-07-19 DIAGNOSIS — J302 Other seasonal allergic rhinitis: Secondary | ICD-10-CM | POA: Diagnosis not present

## 2023-07-20 DIAGNOSIS — E1165 Type 2 diabetes mellitus with hyperglycemia: Secondary | ICD-10-CM | POA: Diagnosis not present

## 2023-09-06 DIAGNOSIS — Z0100 Encounter for examination of eyes and vision without abnormal findings: Secondary | ICD-10-CM | POA: Diagnosis not present

## 2023-09-10 DIAGNOSIS — R768 Other specified abnormal immunological findings in serum: Secondary | ICD-10-CM | POA: Diagnosis not present

## 2023-09-10 DIAGNOSIS — K219 Gastro-esophageal reflux disease without esophagitis: Secondary | ICD-10-CM | POA: Diagnosis not present

## 2023-09-10 DIAGNOSIS — I10 Essential (primary) hypertension: Secondary | ICD-10-CM | POA: Diagnosis not present

## 2023-09-10 DIAGNOSIS — J45909 Unspecified asthma, uncomplicated: Secondary | ICD-10-CM | POA: Diagnosis not present

## 2023-09-10 DIAGNOSIS — J302 Other seasonal allergic rhinitis: Secondary | ICD-10-CM | POA: Diagnosis not present

## 2023-09-10 DIAGNOSIS — E782 Mixed hyperlipidemia: Secondary | ICD-10-CM | POA: Diagnosis not present

## 2023-09-10 DIAGNOSIS — R1030 Lower abdominal pain, unspecified: Secondary | ICD-10-CM | POA: Diagnosis not present

## 2023-09-10 DIAGNOSIS — E1165 Type 2 diabetes mellitus with hyperglycemia: Secondary | ICD-10-CM | POA: Diagnosis not present

## 2023-09-10 DIAGNOSIS — M2550 Pain in unspecified joint: Secondary | ICD-10-CM | POA: Diagnosis not present

## 2023-09-24 ENCOUNTER — Encounter: Payer: Self-pay | Admitting: Internal Medicine

## 2023-09-24 ENCOUNTER — Ambulatory Visit (INDEPENDENT_AMBULATORY_CARE_PROVIDER_SITE_OTHER): Payer: 59 | Admitting: Internal Medicine

## 2023-09-24 ENCOUNTER — Ambulatory Visit: Payer: 59

## 2023-09-24 VITALS — BP 134/68 | HR 90 | Temp 98.3°F | Ht 61.0 in | Wt 154.8 lb

## 2023-09-24 DIAGNOSIS — J4530 Mild persistent asthma, uncomplicated: Secondary | ICD-10-CM | POA: Diagnosis not present

## 2023-09-24 DIAGNOSIS — R079 Chest pain, unspecified: Secondary | ICD-10-CM | POA: Diagnosis not present

## 2023-09-24 NOTE — Progress Notes (Signed)
Subjective:   Patient ID: Leslie Scott, female    DOB: April 22, 1966    MRN: [id number]    Brief patient profile:  58 yo [place] former smoker  with asthma since age 71 on Theophylline ever since better in 2006 breathing while in [place] and reduction to may be once a day but breathing worse in 2014 despite quit smoking 2007 [place] 2013 and referred to pulmonary clinic 08/12/13 by Dr Amada Kingfisher bonsu for eval of lung nodule      History of Present Illness  08/12/2014 1st [facility] office visit/ Sherene Sires  / maintained on advair and theoph Chief Complaint  Patient presents with   Pulmonary Consult    Referred by Dr. Julio Sicks for eval of pulmonary nodule. Pt states that she was dxed with asthma at age 38. She c/o increased SOB for the past yr.  She states that she gets SOB with "any rapid movement".    last pred x 6 months/ still on theoph with [facility] of gerd [facility]/ has chest tightness better with saba and doe x anything more than slow adls x years rec No more CT scans for 6 months  Stop theophylline and advair  Dulera 200 Take 2 puffs first thing in am and then another 2 puffs about 12 hours later and if happy with it then fill the Rx  Work on inhaler technique:  relax and gently blow all the way out then take a nice smooth deep breath back in, triggering the inhaler at same time you start breathing in.  Hold for up to 5 seconds if you can.  Rinse and gargle with water when done Only use your albuterol(proair)  as a rescue medication Nexium 40 mg Take 30- 60 min before your first and last meals of the day  GERD  Diet     02/24/2016  f/u ov/Jlyn Bracamonte re: asthma/ happy with dulera 200 one twice  Daily (misunderstood instructions Chief Complaint  Patient presents with   Follow-up    Breathing is doing well on Dulera 100. She has not had to use albuterol inhaler or neb. No new co's today.   Not limited by breathing from desired activities  Rec Plan A = Automatic =  Dulera 100 Take 2  puffs first thing in am and then another 2 puffs about 12 hours later.  Work on inhaler technique:   Plan B = Backup Only use your albuterol (ventolin)  as a rescue medication  Plan C = Crisis - only use your albuterol nebulizer if you first try Plan B   Pulmonary follow up is as needed     05/22/2022  f/u ov/Abdalla Naramore re: asthma maint on symbicort 80 2bid  did bring meds  Chief Complaint  Patient presents with   Follow-up    No new issues since LOV  Dyspnea:  still  not back to normal eg having trouble finishing [employer] s resting  Cough: none  Sleeping: flat bed / 1-2 pillows on side  SABA use: 2 x per week 02: none   Rec Plan A = Automatic = Always=    Symbicort 80 Take 2 puffs first thing in am and then another 2 puffs about 12 hours later.   Plan B = Backup (to supplement plan A, not to replace it) Only use your albuterol inhaler as a rescue medication Plan C = Crisis (instead of Plan B but only if Plan B stops working) - only use your albuterol  nebulizer if you first try Plan B  For cough  >  tessalon 200 mg every 4-6 hours        02/21/2023  f/u ov/Puneet Masoner re: asthma  maint on symbicort  did not bring meds - did not take symb am of ov Chief Complaint  Patient presents with   Follow-up    Wheezing   Dyspnea:  worse x several  weeks  Cough: not much at all  Sleeping: flat bed 2 pillows s resp cc SABA use: now waking up and needing saba  02: none  Rec Change symbicort to 160 and Take 2 puffs first thing in am and then another 2 puffs about 12 hours later.   When back to normal to go back to Symbicort 80 especially if the higher strength bothers your throat  Work on inhaler technique:      Please schedule a follow up visit in 3 months but call sooner if needed  with all medications /inhalers/ solutions in hand so we can verify exactly what you are taking. This includes all medications from all doctors and over the counters   Tamiflu rx finished 2 days prior to OV      09/24/2023  Acute f/u  ov/Andrewjames Weirauch re: asthma since childhood worse p covid  maint on symbicort 160 did not  bring meds  Chief Complaint  Patient presents with   Follow-up    Dx with Flu A.  PCP rx Tamiflu.    Dyspnea:  improving  Cough: better,  mucus turing white  Sleeping: 30 degrees HOB s  resp cc  SABA use: 3 x daily vs once a day at most  prior to flu 02: none  Since onset of flu has had positional R post post shoulder pain/ no pleuritic or ex features.      No obvious day to day or daytime variability or assoc  purulent sputum or mucus plugs or hemoptysis or cp or chest tightness, subjective wheeze or overt sinus or hb symptoms.    Also denies any obvious fluctuation of symptoms with weather or environmental changes or other aggravating or alleviating factors except as outlined above   No unusual exposure hx or h/o childhood pna or knowledge of premature birth.  Current Allergies, Complete Past Medical History, Past Surgical History, Family History, and Social History were reviewed in [employer] record.  ROS  The following are not active complaints unless bolded Hoarseness, sore throat, dysphagia, dental problems, itching, sneezing,  nasal congestion or discharge of excess mucus or purulent secretions, ear ache,   fever, chills, sweats, unintended wt loss or wt gain, classically pleuritic or exertional cp,  orthopnea pnd or arm/hand swelling  or leg swelling, presyncope, palpitations, abdominal pain, anorexia, nausea, vomiting, diarrhea  or change in bowel habits or change in bladder habits, change in stools or change in urine, dysuria, hematuria,  rash, arthralgias, visual complaints, headache, numbness, weakness or ataxia or problems with walking or coordination,  change in mood or  memory.        Current Meds  Medication Sig   budesonide-formoterol (SYMBICORT) 160-4.5 MCG/ACT inhaler Inhale 2 puffs into the lungs 2 (two) times daily.   famotidine  (PEPCID) 20 MG tablet Take 1 tablet (20 mg total) by mouth 2 (two) times daily.   INVOKANA 300 MG TABS tablet Take 300 mg by mouth daily before breakfast.   losartan (COZAAR) 25 MG tablet Take 25 mg by mouth every morning.    LYUMJEV [facility]  100 UNIT/ML KwikPen ADMINISTER 4-10 UNITS THREE TIMES DAILY WITH MEALS. MAX DAILY DOSEX 30   metoprolol succinate (TOPROL-XL) 50 MG 24 hr tablet Take 50 mg by mouth every morning.    MOUNJARO 7.5 MG/0.5ML Pen Inject 7.5 mg into the skin once a week.   Nebulizers (COMPRESSOR NEBULIZER) MISC Use as directed   omeprazole (PRILOSEC) 40 MG capsule Take 40 mg by mouth daily.   TRESIBA FLEXTOUCH 200 UNIT/ML FlexTouch Pen Inject 36 Units into the skin in the morning and at bedtime.   VENTOLIN HFA 108 (90 Base) MCG/ACT inhaler INHALE 2 PUFFS INTO THE LUNGS EVERY 6 (SIX) HOURS AS NEEDED FOR WHEEZING OR SHORTNESS OF BREATH.                     Objective:   Physical Exam  Wts 09/24/2023        154  02/21/2023        163  05/22/2022      161  04/23/2022        165   09/24/2014        184     Vital signs reviewed  09/24/2023  - Note at rest 02 sats  98% on RA   General appearance:    Amb latina  nad/ prominent PW resolves with PLB     HEENT : Oropharynx  clear        NECK :  without  apparent JVD/ palpable Nodes/TM    LUNGS: no acc muscle use,  Nl contour chest which is clear to A and P bilaterally without cough on insp or exp maneuvers   CV:  RRR  no s3 or murmur or increase in P2, and no edema   ABD:  soft and nontender   MS:  Gait nl   ext warm without deformities Or obvious joint restrictions  calf tenderness, cyanosis or clubbing    SKIN: warm and dry without lesions    NEURO:  alert, approp, nl sensorium with  no motor or cerebellar deficits apparent.      CXR PA and Lateral:   09/24/2023 :    I personally reviewed images and impression is as follows:     No ptx or acuter findings       Assessment & Plan:

## 2023-09-24 NOTE — Patient Instructions (Signed)
Please remember to go to the  x-ray department  for your tests - we will call you with the results when they are available    No change in medications   Follow up is as needed

## 2023-09-24 NOTE — Assessment & Plan Note (Addendum)
Onset childhood while in [place] the[place] d/c'd  08/12/14 D - PFT's 09/24/14  wnl except for ERV 19% and non specific reduction in fef25-75  -08/18/2015    try step down to dulera 100 > took 200 one bid instead - 02/24/2016  After extensive coaching HFA effectiveness =    75% try dulea 100 2bid> did fine until covid 24 May 2020 despite non-adherence due to insurance restrictions - Aug 2023 restarted symb 80 prn but sill overusing hfa - 04/23/2022  After extensive coaching inhaler device,  effectiveness =    75% from a baseline of 50 % so rec symb 80 2bid (not prn) max gerd rx / tessalon 200 prn and and approp use of saba  - 02/21/2023  After extensive coaching inhaler device,  effectiveness =    60% (short ti) so try symb160 for mild flare then back to 80 to reduce upper airway irritation which should do better if she can master hfa  - 09/24/2023  After extensive coaching inhaler device,  effectiveness =    90%   Mild flare with flu managed with prn saba with no need to change maint rx = symbiocort 160 2bid with very good hfa technique, so no change in rx needed  Refills per PCP - f/u in pulmonary clinic prn          Each maintenance medication was reviewed in detail including emphasizing most importantly the difference between maintenance and prns and under what circumstances the prns are to be triggered using an action plan format where appropriate.  Total time for H and P, chart review, counseling, reviewing hfa  device(s) and generating customized AVS unique to this office visit / same day charting = 31 min

## 2023-10-14 DIAGNOSIS — H43823 Vitreomacular adhesion, bilateral: Secondary | ICD-10-CM | POA: Diagnosis not present

## 2023-10-14 DIAGNOSIS — E113491 Type 2 diabetes mellitus with severe nonproliferative diabetic retinopathy without macular edema, right eye: Secondary | ICD-10-CM | POA: Diagnosis not present

## 2023-10-14 DIAGNOSIS — E113512 Type 2 diabetes mellitus with proliferative diabetic retinopathy with macular edema, left eye: Secondary | ICD-10-CM | POA: Diagnosis not present

## 2023-10-15 DIAGNOSIS — J302 Other seasonal allergic rhinitis: Secondary | ICD-10-CM | POA: Diagnosis not present

## 2023-10-15 DIAGNOSIS — K219 Gastro-esophageal reflux disease without esophagitis: Secondary | ICD-10-CM | POA: Diagnosis not present

## 2023-10-15 DIAGNOSIS — M2550 Pain in unspecified joint: Secondary | ICD-10-CM | POA: Diagnosis not present

## 2023-10-15 DIAGNOSIS — R768 Other specified abnormal immunological findings in serum: Secondary | ICD-10-CM | POA: Diagnosis not present

## 2023-10-15 DIAGNOSIS — E1165 Type 2 diabetes mellitus with hyperglycemia: Secondary | ICD-10-CM | POA: Diagnosis not present

## 2023-10-15 DIAGNOSIS — E782 Mixed hyperlipidemia: Secondary | ICD-10-CM | POA: Diagnosis not present

## 2023-10-15 DIAGNOSIS — J45909 Unspecified asthma, uncomplicated: Secondary | ICD-10-CM | POA: Diagnosis not present

## 2023-10-15 DIAGNOSIS — I10 Essential (primary) hypertension: Secondary | ICD-10-CM | POA: Diagnosis not present

## 2023-10-23 DIAGNOSIS — E1165 Type 2 diabetes mellitus with hyperglycemia: Secondary | ICD-10-CM | POA: Diagnosis not present

## 2023-10-23 DIAGNOSIS — Z794 Long term (current) use of insulin: Secondary | ICD-10-CM | POA: Diagnosis not present

## 2023-10-29 DIAGNOSIS — E113512 Type 2 diabetes mellitus with proliferative diabetic retinopathy with macular edema, left eye: Secondary | ICD-10-CM | POA: Diagnosis not present

## 2023-10-29 DIAGNOSIS — E113491 Type 2 diabetes mellitus with severe nonproliferative diabetic retinopathy without macular edema, right eye: Secondary | ICD-10-CM | POA: Diagnosis not present

## 2023-11-12 ENCOUNTER — Ambulatory Visit: Payer: Medicare HMO | Attending: Internal Medicine | Admitting: Internal Medicine

## 2023-11-12 ENCOUNTER — Encounter: Payer: Self-pay | Admitting: Internal Medicine

## 2023-11-12 VITALS — BP 120/77 | HR 92 | Resp 14 | Ht 61.0 in | Wt 157.0 lb

## 2023-11-12 DIAGNOSIS — M2550 Pain in unspecified joint: Secondary | ICD-10-CM | POA: Diagnosis not present

## 2023-11-12 DIAGNOSIS — M7918 Myalgia, other site: Secondary | ICD-10-CM

## 2023-11-12 DIAGNOSIS — R918 Other nonspecific abnormal finding of lung field: Secondary | ICD-10-CM

## 2023-11-12 DIAGNOSIS — R768 Other specified abnormal immunological findings in serum: Secondary | ICD-10-CM

## 2023-11-12 MED ORDER — CYCLOBENZAPRINE HCL 10 MG PO TABS: 10.0000 mg | ORAL_TABLET | Freq: Every evening | ORAL | 0 refills | Status: DC | PRN

## 2023-11-12 NOTE — Patient Instructions (Signed)
 I recommend checking out the [employer] patient-centered guide for fibromyalgia and chronic pain management: [email or web address]-[facility].net/   Scapular depression and retraction After you have practiced this exercise, try doing it without the arm support. Then, try doing it while standing instead of sitting. Sit on a stable chair. Support your arms in front of you with pillows, armrests, or a tabletop. Keep your elbows near the sides of your body. Gently move your shoulder blades down (scapular depression) and back toward your spine (retraction). Relax the muscles on the tops of your shoulders and in the back of your neck. Hold for __________ seconds. Slowly release the tension, and relax your muscles completely before you repeat the exercise. Repeat __________ times. Complete this exercise __________ times a day. Scapular retraction, isotonic  Sit in a stable chair without armrests or stand up. Secure an exercise band to a stable object in front of you so the band is at shoulder height. Hold one end of the exercise band in each hand. Squeeze your shoulder blades (scapulae) together and move your elbows slightly behind you (retraction). Do not shrug your shoulders upward while you do this. Hold for __________ seconds. Slowly return to the starting position. Repeat __________ times. Complete this exercise __________ times a day. Shoulder extension with scapular retraction, isotonic  Sit in a stable chair without armrests or stand up. Secure an exercise band to a stable object in front of you so the band is above shoulder height. Hold one end of the exercise band in each hand. Straighten your elbows and lift your hands up to shoulder height. Squeeze your shoulder blades together (scapular retraction) and pull your hands down to the sides of your thighs (shoulder extension). Stop when your hands are straight down by your sides. Do not let your hands go behind your body. Hold for  __________ seconds. Slowly return to the starting position. Repeat __________ times. Complete this exercise __________ times a day. Shoulder abduction, isotonic  Sit in a stable chair without armrests or stand up. If told, hold a __________ weight in your left / right hand. Start with your arms straight down. Turn your left / right hand so your palm faces in, toward your body. Slowly lift your left / right hand out to your side (abduction). Do not lift your hand above shoulder height. Keep your arms straight. Avoid shrugging your shoulder while you do this movement. Keep your shoulder blade tucked down toward the middle of your back. Hold for __________ seconds. Slowly lower your arm, and return to the starting position. Repeat __________ times. Complete this exercise __________ times a day.

## 2023-11-12 NOTE — Progress Notes (Signed)
 Office Visit Note  Patient: Leslie Scott             Date of Birth: 23-Apr-1966           MRN: [id number]             PCP: Tretha Fu, MD Referring: Dianah Fort, PA Visit Date: 11/12/2023 Occupation: [employer]  Subjective:  New Patient (Initial Visit) (Patient states she is having pain in her neck and down to her elbows. Patient states she has a spot on her back she would like checked out as well. Patient states she does feel tired a lot. )   Discussed the use of AI scribe software for clinical note transcription with the patient, who gave verbal consent to proceed.  History of Present Illness   Leslie Scott is a 58 year old female who presents with joint pain, stiffness, and fatigue with positive ANA .  She has been experiencing joint pain, stiffness, and fatigue for over six months. The pain is primarily located in her neck, back, and arms, described as feeling like 'two blocks on my shoulders' with soreness in her elbows. The pain is worse at night, often waking her from sleep, and is constant during the night. She experiences shooting pain from her lower back to her shoulders and neck, sometimes affecting her whole arms. The onset was gradual, initially affecting her neck and back, and has since progressed to involve her arms. She feels sore in the morning, which improves slightly with movement throughout the day. No swelling, rashes, or significant headaches are reported. She experiences numbness, attributed to a pre-existing hernia in her neck, but states that the current pain feels different and is more bone-related.  Her sleep is disrupted due to pain, leading to daytime fatigue. She falls asleep easily during the day if sitting still. No shooting pain when her neck is manipulated and no significant numbness related to her current pain.  For symptom management, she uses a topical cream and takes Advil, which provides relief for about four hours. She avoids high doses of  ibuprofen and uses a heat pad for relief. She has been prescribed a pain medication but prefers to limit its use, opting instead for Advil and Tylenol.  She is a Interior and spatial designer, which involves standing for long periods, potentially contributing to her symptoms. She has a history of shoulder injury and has received a shot in her shoulder, with surgery being considered.       Labs reviewed 05/2023 ANA 1:40 cytoplasmic   Activities of Daily Living:  Patient reports morning stiffness for 3-4 hours.   Patient Reports nocturnal pain.  Difficulty dressing/grooming: Reports Difficulty climbing stairs: Reports Difficulty getting out of chair: Reports Difficulty using hands for taps, buttons, cutlery, and/or writing: Reports  Review of Systems  Constitutional:  Positive for fatigue.  HENT:  Positive for mouth dryness. Negative for mouth sores.   Eyes:  Positive for dryness.  Respiratory:  Positive for shortness of breath.   Cardiovascular:  Positive for palpitations. Negative for chest pain.  Gastrointestinal:  Negative for blood in stool, constipation and diarrhea.  Endocrine: Negative for increased urination.  Genitourinary:  Negative for involuntary urination.  Musculoskeletal:  Positive for joint pain, joint pain, joint swelling, myalgias, muscle weakness, morning stiffness, muscle tenderness and myalgias. Negative for gait problem.  Skin:  Positive for color change. Negative for rash, hair loss and sensitivity to sunlight.  Allergic/Immunologic: Negative for susceptible to infections.  Neurological:  Positive for dizziness  and headaches.  Hematological:  Negative for swollen glands.  Psychiatric/Behavioral:  Positive for depressed mood and sleep disturbance. The patient is nervous/anxious.     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 edema associated with type 2 diabetes mellitus (HCC) 12/09/2019   Multiple pulmonary nodules 08/15/2014   Mild persistent asthma in adult without complication 08/12/2014    Past Medical History:  Diagnosis Date   Adenotonsillar hypertrophy    Arthritis    shoulders   GERD (gastroesophageal reflux disease)    Hypertension    Mild persistent asthma    pulmologist-  dr wert   OSA (obstructive sleep apnea)    per pt no cpap   Palpitations    SUI (stress urinary incontinence, female)    Type 2 diabetes mellitus treated with insulin (HCC)    followed by pcp    Family History  Problem Relation Age of Onset   Allergies Mother    Asthma Mother    Diabetes Mother    Hypertension Mother    Arthritis Mother    Seizures Mother    Hypertension Father    Diabetes Father    Diabetes Sister    Hypertension Sister    Diabetes Sister    Hypertension Sister    Diabetes Brother    Hypertension Brother    [place] cancer Maternal Aunt    Past Surgical History:  Procedure Laterality Date   ABDOMINOPLASTY  2017   w/ Lipo   BREAST ENHANCEMENT SURGERY Bilateral 2001   and Abdominoplasty   CATARACT EXTRACTION W/ INTRAOCULAR LENS  IMPLANT, BILATERAL  2013   CYSTOSCOPY N/A 01/27/2018   Procedure: CYSTOSCOPY;  Surgeon: Trent Frizzle, MD;  Location: [facility];  Service: Urology;  Laterality: N/A;   PUBOVAGINAL SLING N/A 01/27/2018   Procedure: Gino Lais, Lavone Power;  Surgeon: Trent Frizzle, MD;  Location: [facility];  Service: Urology;  Laterality: N/A;   TONSILLECTOMY AND ADENOIDECTOMY Bilateral 03/03/2019   Procedure: TONSILLECTOMY AND ADENOIDECTOMY;  Surgeon: Reynold Caves, MD;  Location: [facility];  Service: ENT;   Laterality: Bilateral;   Social History   Social History Narrative   Not on file   Immunization History  Administered Date(s) Administered   Influenza Split 05/06/2014, 08/06/2014     Objective: Vital Signs: BP 120/77 (BP Location: Right Arm, Patient Position: Sitting, Cuff Size: Normal)   Pulse 92   Resp 14   Ht 5\' 1"  (1.549 m)   Wt 157 lb (71.2 kg)   LMP 01/13/2018 (Approximate)   BMI 29.66 kg/m    Physical Exam HENT:     Mouth/Throat:     Mouth: Mucous membranes are moist.     Pharynx: Oropharynx is clear.  Eyes:     Conjunctiva/sclera: Conjunctivae normal.  Cardiovascular:     Rate and Rhythm: Normal rate and regular rhythm.  Pulmonary:     Effort: Pulmonary effort is normal.     Breath sounds: Normal breath sounds.  Musculoskeletal:     Right lower leg: No edema.     Left lower leg: No edema.  Lymphadenopathy:     Cervical: No cervical adenopathy.  Skin:    General: Skin is warm and dry.     Findings: No rash.  Neurological:     Mental Status: She is alert.  Psychiatric:        Mood and Affect: Mood normal.      Musculoskeletal Exam:  Left more than right neck stiffness with lateral bending and rotation Tenderness to pressure over trapezius and rhomboids, no focal tenderness on upper or lower arms Shoulders guarding against active abduction overhead, passive ROM intact Elbows full ROM no tenderness or swelling Wrists full ROM no tenderness or swelling Fingers full ROM no tenderness or swelling Knees full ROM no tenderness or swelling   Investigation: No additional findings.  Imaging: No results found.  Recent Labs: Lab Results  Component Value Date   WBC 9.1 08/02/2022   HGB 12.5 08/02/2022   PLT 342 08/02/2022   NA 134 (L) 08/02/2022   K 4.8 08/02/2022   CL 100 08/02/2022   CO2 25 08/02/2022   GLUCOSE 213 (H) 08/02/2022   BUN 22 (H) 08/02/2022   CREATININE 0.98 08/02/2022   BILITOT 0.5 08/02/2022   ALKPHOS 45 08/02/2022   AST 12  (L) 08/02/2022   ALT 14 08/02/2022   PROT 7.7 08/02/2022   ALBUMIN 4.3 08/02/2022   CALCIUM 9.7 08/02/2022   GFRAA >60 02/27/2019    Speciality Comments: No specialty comments available.  Procedures:  No procedures performed Allergies: Morphine and codeine and Shellfish allergy   Assessment / Plan:     Visit Diagnoses: Positive ANA (antinuclear antibody) - Plan: RNP Antibody, Anti-Smith antibody, Sjogrens syndrome-A extractable nuclear antibody, Anti-DNA antibody, double-stranded, C3 and C4, Sedimentation rate, C-reactive protein Low positive ANA test, non-specific marker for autoimmune or inflammatory conditions. No clinical signs of specific autoimmune diseases. Further investigation needed. - Ordered blood tests to evaluate the significance of the positive ANA test and rule out serious autoimmune diseases.   Myofascial pain - Plan: cyclobenzaprine (FLEXERIL) 10 MG tablet Chronic severe neck, shoulder, and arm pain with muscle tightness and tenderness, likely due to muscle overuse and compensation for past injuries. No severe osteoarthritis or rheumatoid arthritis. - Prescribed Flexeril 10 mg at night for muscle relaxation and sleep improvement. Advised halving dose if drowsiness occurs. - Provided information on stretching exercises and online resources for myofascial pain management. - Recommended heat pads and stretching exercises for muscle tension relief. - Discussed potential benefits of physical therapy, dry needling, and trigger point injections.  Chronic Neck and Shoulder Pain Chronic neck and shoulder pain due to muscle overuse and compensation, leading to myofascial pain. Under pain management care with a specialist. Surgery deferred until generalized pain is managed. - Continue current pain management strategies with the specialist. - Consider surgical intervention for the shoulder after addressing generalized pain.    Orders: Orders Placed This Encounter  Procedures    RNP Antibody   Anti-Smith antibody   Sjogrens syndrome-A extractable nuclear antibody   Anti-DNA antibody, double-stranded   C3 and C4   Sedimentation rate   C-reactive protein   Meds ordered this encounter  Medications   cyclobenzaprine (FLEXERIL) 10 MG tablet    Sig: Take 1 tablet (10 mg total) by mouth at bedtime as needed.    Dispense:  30 tablet    Refill:  0     Follow-Up Instructions: No follow-ups on file.   Matt Song, MD  Note - This record has been created using [employer].  Chart creation errors have been sought, but may  not always  have been located. Such creation errors do not reflect on  the standard of medical care.

## 2023-11-13 LAB — C3 AND C4
C3 Complement: 145 mg/dL (ref 83–193)
C4 Complement: 26 mg/dL (ref 15–57)

## 2023-11-13 LAB — ANTI-SMITH ANTIBODY: ENA SM Ab Ser-aCnc: <1 AI

## 2023-11-13 LAB — RNP ANTIBODY: Ribonucleic Protein(ENA) Antibody, IgG: <1 AI

## 2023-11-13 LAB — SJOGRENS SYNDROME-A EXTRACTABLE NUCLEAR ANTIBODY: SSA (Ro) (ENA) Antibody, IgG: <1 AI

## 2023-11-13 LAB — C-REACTIVE PROTEIN: CRP: 5.9 mg/L (ref <8.0)

## 2023-11-13 LAB — SEDIMENTATION RATE: Sed Rate: 9 mm/h (ref 0–30)

## 2023-11-13 LAB — ANTI-DNA ANTIBODY, DOUBLE-STRANDED: ds DNA Ab: <1 [IU]/mL

## 2023-11-19 ENCOUNTER — Telehealth: Payer: Self-pay

## 2023-11-19 NOTE — Progress Notes (Signed)
   11/19/2023  Patient ID: Leslie Scott, female   DOB: 02-29-1966, 57 y.o.   MRN: [id number]  Contacted patient regarding diabetes from a quality report for [facility]. The patient failed the Control of Diabetes measure in 2024 with an A1c >9%.   I spoke with the patient today and congratulated her on her progress with her most recent A1c of 7.7% (09/10/23). The patient is continuing to see her endocrinologist at [facility] and did not want to review her medications with me, nor did she report any medication related issues or concerns at this time.   I will be happy to assist this patient in the future if any pharmacy needs arise.   Livia Riffle, PharmD Clinical Pharmacist  [phone]

## 2023-12-05 DIAGNOSIS — M542 Cervicalgia: Secondary | ICD-10-CM | POA: Diagnosis not present

## 2023-12-23 DIAGNOSIS — M542 Cervicalgia: Secondary | ICD-10-CM | POA: Diagnosis not present

## 2024-01-24 DIAGNOSIS — I10 Essential (primary) hypertension: Secondary | ICD-10-CM | POA: Diagnosis not present

## 2024-01-24 DIAGNOSIS — K219 Gastro-esophageal reflux disease without esophagitis: Secondary | ICD-10-CM | POA: Diagnosis not present

## 2024-01-24 DIAGNOSIS — E1165 Type 2 diabetes mellitus with hyperglycemia: Secondary | ICD-10-CM | POA: Diagnosis not present

## 2024-01-24 DIAGNOSIS — Z0001 Encounter for general adult medical examination with abnormal findings: Secondary | ICD-10-CM | POA: Diagnosis not present

## 2024-01-24 DIAGNOSIS — J45909 Unspecified asthma, uncomplicated: Secondary | ICD-10-CM | POA: Diagnosis not present

## 2024-01-24 DIAGNOSIS — J302 Other seasonal allergic rhinitis: Secondary | ICD-10-CM | POA: Diagnosis not present

## 2024-01-24 DIAGNOSIS — M2550 Pain in unspecified joint: Secondary | ICD-10-CM | POA: Diagnosis not present

## 2024-01-24 DIAGNOSIS — E782 Mixed hyperlipidemia: Secondary | ICD-10-CM | POA: Diagnosis not present

## 2024-01-27 DIAGNOSIS — E113512 Type 2 diabetes mellitus with proliferative diabetic retinopathy with macular edema, left eye: Secondary | ICD-10-CM | POA: Diagnosis not present

## 2024-01-27 DIAGNOSIS — E113491 Type 2 diabetes mellitus with severe nonproliferative diabetic retinopathy without macular edema, right eye: Secondary | ICD-10-CM | POA: Diagnosis not present

## 2024-01-27 DIAGNOSIS — H43823 Vitreomacular adhesion, bilateral: Secondary | ICD-10-CM | POA: Diagnosis not present

## 2024-02-24 DIAGNOSIS — Z794 Long term (current) use of insulin: Secondary | ICD-10-CM | POA: Diagnosis not present

## 2024-02-24 DIAGNOSIS — E1165 Type 2 diabetes mellitus with hyperglycemia: Secondary | ICD-10-CM | POA: Diagnosis not present

## 2024-03-09 DIAGNOSIS — J45909 Unspecified asthma, uncomplicated: Secondary | ICD-10-CM | POA: Diagnosis not present

## 2024-03-09 DIAGNOSIS — I10 Essential (primary) hypertension: Secondary | ICD-10-CM | POA: Diagnosis not present

## 2024-03-09 DIAGNOSIS — M2550 Pain in unspecified joint: Secondary | ICD-10-CM | POA: Diagnosis not present

## 2024-03-09 DIAGNOSIS — J302 Other seasonal allergic rhinitis: Secondary | ICD-10-CM | POA: Diagnosis not present

## 2024-03-09 DIAGNOSIS — K219 Gastro-esophageal reflux disease without esophagitis: Secondary | ICD-10-CM | POA: Diagnosis not present

## 2024-03-09 DIAGNOSIS — E782 Mixed hyperlipidemia: Secondary | ICD-10-CM | POA: Diagnosis not present

## 2024-03-09 DIAGNOSIS — E1165 Type 2 diabetes mellitus with hyperglycemia: Secondary | ICD-10-CM | POA: Diagnosis not present

## 2024-03-09 DIAGNOSIS — Z0000 Encounter for general adult medical examination without abnormal findings: Secondary | ICD-10-CM | POA: Diagnosis not present

## 2024-03-13 ENCOUNTER — Telehealth: Payer: Self-pay

## 2024-03-13 DIAGNOSIS — S46011D Strain of muscle(s) and tendon(s) of the rotator cuff of right shoulder, subsequent encounter: Secondary | ICD-10-CM | POA: Diagnosis not present

## 2024-03-13 MED ORDER — ALBUTEROL SULFATE HFA 108 (90 BASE) MCG/ACT IN AERS: 2.0000 | INHALATION_SPRAY | Freq: Four times a day (QID) | RESPIRATORY_TRACT | 6 refills | Status: AC | PRN

## 2024-03-13 NOTE — Telephone Encounter (Signed)
 Rx has been sent to pharmacy & pt is aware. Nfn

## 2024-03-13 NOTE — Telephone Encounter (Signed)
 Copied from CRM [id number]. Topic: Clinical - Prescription Issue >> Mar 13, 2024 11:29 AM Leslie Scott wrote: Reason for CRM: PT CALLED WANTING THIS MED REFILLED albuterol  (PROAIR  HFA) 108 (90 BASE) MCG/ACT inhaler [[id number] AND HER PCP WAS FILLING IT BUT NOW SHE SEE DR DARLEAN AND SHE IS NEEDING A NEW PRESCRIPTION.STATED SHE WANTS THE BLUE ONE.    Dr. DARLEAN is it okay to refill Ventolin  HFA inhaler. Pts PCP has been filling this

## 2024-03-14 DIAGNOSIS — Z1231 Encounter for screening mammogram for malignant neoplasm of breast: Secondary | ICD-10-CM | POA: Diagnosis not present

## 2024-04-22 DIAGNOSIS — R051 Acute cough: Secondary | ICD-10-CM | POA: Diagnosis not present

## 2024-04-22 DIAGNOSIS — J3489 Other specified disorders of nose and nasal sinuses: Secondary | ICD-10-CM | POA: Diagnosis not present

## 2024-04-27 DIAGNOSIS — E113512 Type 2 diabetes mellitus with proliferative diabetic retinopathy with macular edema, left eye: Secondary | ICD-10-CM | POA: Diagnosis not present

## 2024-04-27 DIAGNOSIS — H43823 Vitreomacular adhesion, bilateral: Secondary | ICD-10-CM | POA: Diagnosis not present

## 2024-04-27 DIAGNOSIS — E113491 Type 2 diabetes mellitus with severe nonproliferative diabetic retinopathy without macular edema, right eye: Secondary | ICD-10-CM | POA: Diagnosis not present

## 2024-04-29 NOTE — H&P (Signed)
 PREOPERATIVE H&P  Chief Complaint: right shoulder pain  HPI: Leslie Scott is a 58 y.o. female who presents for her right shoulder. She has 10/10 pain around the right shoulder. Difficulty with lifting it. It wakes her up at night. She has gotten her blood sugars under better control. She is still very limited. She wants to have her shoulder definitively fixed.  Past Medical History:  Diagnosis Date   Adenotonsillar hypertrophy    Arthritis    shoulders   GERD (gastroesophageal reflux disease)    Hypertension    Mild persistent asthma    pulmologist-  dr wert   OSA (obstructive sleep apnea)    per pt no cpap   Palpitations    SUI (stress urinary incontinence, female)    Type 2 diabetes mellitus treated with insulin (HCC)    followed by pcp   Past Surgical History:  Procedure Laterality Date   ABDOMINOPLASTY  2017   w/ Lipo   BREAST ENHANCEMENT SURGERY Bilateral 2001   and Abdominoplasty   CATARACT EXTRACTION W/ INTRAOCULAR LENS  IMPLANT, BILATERAL  2013   CYSTOSCOPY N/A 01/27/2018   Procedure: CYSTOSCOPY;  Surgeon: Matilda Senior, MD;  Location: [facility];  Service: Urology;  Laterality: N/A;   PUBOVAGINAL SLING N/A 01/27/2018   Procedure: CARLOYN GLADE, OZIE GLADE;  Surgeon: Matilda Senior, MD;  Location: [facility];  Service: Urology;  Laterality: N/A;   TONSILLECTOMY AND ADENOIDECTOMY Bilateral 03/03/2019   Procedure: TONSILLECTOMY AND ADENOIDECTOMY;  Surgeon: Karis Clunes, MD;  Location: [facility];  Service: ENT;  Laterality: Bilateral;   Social History   Socioeconomic History   Marital status: Married    Spouse name: Not on file   Number of children: Not on file   Years of education: Not on file   Highest education level: Not on file  Occupational History   Not on file  Tobacco Use   Smoking status: Former    Current packs/day: 0.00    Average packs/day: 0.5 packs/day for 20.0 years (10.0 ttl pk-yrs)     Types: Cigarettes    Start date: 08/06/1985    Quit date: 08/06/2005    Years since quitting: 18.7   Smokeless tobacco: Never  Vaping Use   Vaping status: Never Used  Substance and Sexual Activity   Alcohol use: Yes    Alcohol/week: 0.0 standard drinks of alcohol    Comment: OCCASIONAL   Drug use: No   Sexual activity: Not on file    Comment: IUD placed 2016  Other Topics Concern   Not on file  Social History Narrative   Not on file   Social Drivers of Health   Financial Resource Strain: Not on file  Food Insecurity: Not on file  Transportation Needs: Not on file  Physical Activity: Not on file  Stress: Not on file  Social Connections: Not on file   Family History  Problem Relation Age of Onset   Allergies Mother    Asthma Mother    Diabetes Mother    Hypertension Mother    Arthritis Mother    Seizures Mother    Hypertension Father    Diabetes Father    Diabetes Sister    Hypertension Sister    Diabetes Sister    Hypertension Sister    Diabetes Brother    Hypertension Brother    [place] cancer Maternal Aunt    Allergies  Allergen Reactions   Morphine And Codeine Shortness Of Breath   Shellfish  Allergy Anaphylaxis    ALL SEAFOOD   Prior to Admission medications   Medication Sig Start Date End Date Taking? Authorizing Provider  Acetaminophen  (TYLENOL  PO) Take by mouth.    [provider]  albuterol  (VENTOLIN  HFA) 108 (90 Base) MCG/ACT inhaler Inhale 2 puffs into the lungs every 6 (six) hours as needed for wheezing or shortness of breath. 03/13/24   Darlean Ozell NOVAK, MD  budesonide -formoterol  (SYMBICORT ) 160-4.5 MCG/ACT inhaler Inhale 2 puffs into the lungs 2 (two) times daily. 05/15/23   Darlean Ozell NOVAK, MD  cetirizine (ZYRTEC) 10 MG tablet Take 1 tablet by mouth daily.    [provider]  Continuous Glucose Receiver (FREESTYLE LIBRE 3 READER) DEVI See admin instructions. 10/28/23   [provider]  Continuous Glucose Sensor (FREESTYLE LIBRE 3  PLUS SENSOR) MISC CHANGE EVERY 15 DAYS 10/28/23   [provider]  cyclobenzaprine  (FLEXERIL ) 10 MG tablet Take 1 tablet (10 mg total) by mouth at bedtime as needed. 11/12/23   Rice, Lonni ORN, MD  Diclofenac Sodium 3 % GEL APPLY 1 APPLICATION TOPICALLY TWICE A DAY    [provider]  estradiol  (ESTRACE ) 0.5 MG tablet TAKE 1 TABLET BY MOUTH EVERY DAY = NO MORE REFILLS UNLESS OV    [provider]  famotidine  (PEPCID ) 20 MG tablet Take 1 tablet (20 mg total) by mouth 2 (two) times daily. 03/16/18   Layden, Lindsey A, PA-C  fluconazole (DIFLUCAN) 150 MG tablet Take 150 mg by mouth once. 06/20/23   [provider]  Ibuprofen (ADVIL) 200 MG CAPS Take by mouth.    [provider]  ibuprofen (ADVIL) 800 MG tablet Take 1 tablet by mouth 3 (three) times daily.    [provider]  INVOKANA 300 MG TABS tablet Take 300 mg by mouth daily before breakfast.    [provider]  losartan (COZAAR) 100 MG tablet Take 100 mg by mouth daily. 10/07/23   [provider]  losartan (COZAAR) 25 MG tablet Take 25 mg by mouth every morning.  Patient not taking: Reported on 11/12/2023    [provider]  LYUMJEV KWIKPEN 100 UNIT/ML KwikPen ADMINISTER 4-10 UNITS THREE TIMES DAILY WITH MEALS. MAX DAILY DOSEX 30 03/27/23   [provider]  medroxyPROGESTERone (PROVERA) 2.5 MG tablet one tablet daily by mouth 10/28/23   [provider]  metoprolol succinate (TOPROL-XL) 50 MG 24 hr tablet Take 50 mg by mouth every morning.  09/02/14   [provider]  MOUNJARO 7.5 MG/0.5ML Pen Inject 7.5 mg into the skin once a week. 03/27/23   [provider]  Nebulizers (COMPRESSOR NEBULIZER) MISC Use as directed 07/13/22   Darlean Ozell NOVAK, MD  omeprazole (PRILOSEC) 40 MG capsule Take 40 mg by mouth daily.    [provider]  TRESIBA FLEXTOUCH 200 UNIT/ML FlexTouch Pen Inject 36 Units into the skin in the morning and at bedtime.  06/28/23   [provider]  VENTOLIN  HFA 108 (90 Base) MCG/ACT inhaler INHALE 2 PUFFS INTO THE LUNGS EVERY 6 (SIX) HOURS AS NEEDED FOR WHEEZING OR SHORTNESS OF BREATH. 07/16/16   Darlean Ozell NOVAK, MD  YUVAFEM  10 MCG TABS vaginal tablet ONE TABLET PLACED VAGINA TWICE WEEKLY    [provider]     Positive ROS: All other systems have been reviewed and were otherwise negative with the exception of those mentioned in the HPI and as above.  Physical Exam: General: Alert, no acute distress Cardiovascular: No pedal edema Respiratory: No  cyanosis, no use of accessory musculature GI: No organomegaly, abdomen is soft and non-tender Skin: No lesions in the area of chief complaint Neurologic: Sensation intact distally Psychiatric: Patient is competent for consent with normal mood and affect Lymphatic: No axillary or cervical lymphadenopathy  MUSCULOSKELETAL: On exam, 0-40 degrees of motion right shoulder with significant pain. She has pain over the [facility] joint and positive impingement signs.   MRI demonstrates full-thickness cuff tear.  Assessment: Right shoulder impingement syndrome, full-thickness cuff tear, AC arthrosis.   Plan: Plan for Procedure(s): ARTHROSCOPY, SHOULDER, WITH ROTATOR CUFF REPAIR  The risks benefits and alternatives were discussed with the patient including but not limited to the risks of nonoperative treatment, versus surgical intervention including infection, bleeding, nerve injury,  blood clots, cardiopulmonary complications, morbidity, mortality, among others, and they were willing to proceed.   Oden Lindaman K Zeynep Fantroy, PA-C    04/29/2024 1:00 PM

## 2024-05-04 DIAGNOSIS — M12811 Other specific arthropathies, not elsewhere classified, right shoulder: Secondary | ICD-10-CM | POA: Diagnosis not present

## 2024-05-04 DIAGNOSIS — E782 Mixed hyperlipidemia: Secondary | ICD-10-CM | POA: Diagnosis not present

## 2024-05-04 DIAGNOSIS — M75101 Unspecified rotator cuff tear or rupture of right shoulder, not specified as traumatic: Secondary | ICD-10-CM | POA: Diagnosis not present

## 2024-05-04 DIAGNOSIS — E113512 Type 2 diabetes mellitus with proliferative diabetic retinopathy with macular edema, left eye: Secondary | ICD-10-CM | POA: Diagnosis not present

## 2024-05-04 DIAGNOSIS — J45909 Unspecified asthma, uncomplicated: Secondary | ICD-10-CM | POA: Diagnosis not present

## 2024-05-04 DIAGNOSIS — M2550 Pain in unspecified joint: Secondary | ICD-10-CM | POA: Diagnosis not present

## 2024-05-04 DIAGNOSIS — J302 Other seasonal allergic rhinitis: Secondary | ICD-10-CM | POA: Diagnosis not present

## 2024-05-04 DIAGNOSIS — E1165 Type 2 diabetes mellitus with hyperglycemia: Secondary | ICD-10-CM | POA: Diagnosis not present

## 2024-05-04 DIAGNOSIS — Z23 Encounter for immunization: Secondary | ICD-10-CM | POA: Diagnosis not present

## 2024-05-04 DIAGNOSIS — K219 Gastro-esophageal reflux disease without esophagitis: Secondary | ICD-10-CM | POA: Diagnosis not present

## 2024-05-04 DIAGNOSIS — E113491 Type 2 diabetes mellitus with severe nonproliferative diabetic retinopathy without macular edema, right eye: Secondary | ICD-10-CM | POA: Diagnosis not present

## 2024-05-04 DIAGNOSIS — I10 Essential (primary) hypertension: Secondary | ICD-10-CM | POA: Diagnosis not present

## 2024-05-05 ENCOUNTER — Other Ambulatory Visit: Payer: Self-pay

## 2024-05-05 ENCOUNTER — Encounter (HOSPITAL_BASED_OUTPATIENT_CLINIC_OR_DEPARTMENT_OTHER): Payer: Self-pay | Admitting: Orthopedic Surgery

## 2024-05-08 ENCOUNTER — Encounter (HOSPITAL_BASED_OUTPATIENT_CLINIC_OR_DEPARTMENT_OTHER)
Admission: RE | Admit: 2024-05-08 | Discharge: 2024-05-08 | Disposition: A | Discharge status: Home / Self Care | Source: Ambulatory Visit | Attending: Orthopedic Surgery | Admitting: Orthopedic Surgery

## 2024-05-08 DIAGNOSIS — Z01818 Encounter for other preprocedural examination: Secondary | ICD-10-CM | POA: Diagnosis not present

## 2024-05-08 LAB — BASIC METABOLIC PANEL WITH GFR
Anion gap: 9 (ref 5–15)
BUN: 15 mg/dL (ref 6–20)
CO2: 26 mmol/L (ref 22–32)
Calcium: 9.1 mg/dL (ref 8.9–10.3)
Chloride: 103 mmol/L (ref 98–111)
Creatinine, Ser: 0.96 mg/dL (ref 0.44–1.00)
GFR, Estimated: >60 mL/min (ref >60)
Glucose, Bld: 201 mg/dL — ABNORMAL HIGH (ref 70–99)
Potassium: 4 mmol/L (ref 3.5–5.1)
Sodium: 138 mmol/L (ref 135–145)

## 2024-05-08 NOTE — Progress Notes (Signed)

## 2024-05-11 ENCOUNTER — Encounter: Payer: Self-pay | Admitting: Dermatology

## 2024-05-11 ENCOUNTER — Ambulatory Visit: Admitting: Dermatology

## 2024-05-11 VITALS — BP 121/71 | HR 81

## 2024-05-11 DIAGNOSIS — L821 Other seborrheic keratosis: Secondary | ICD-10-CM

## 2024-05-11 DIAGNOSIS — W908XXA Exposure to other nonionizing radiation, initial encounter: Secondary | ICD-10-CM

## 2024-05-11 DIAGNOSIS — L578 Other skin changes due to chronic exposure to nonionizing radiation: Secondary | ICD-10-CM

## 2024-05-11 NOTE — Anesthesia Preprocedure Evaluation (Addendum)
 Anesthesia Evaluation  Patient identified by MRN, date of birth, ID band Patient awake    Reviewed: Allergy & Precautions, H&P , NPO status , Patient's Chart, lab work & pertinent test results  Airway Mallampati: IV  TM Distance: >3 FB Neck ROM: Full    Dental no notable dental hx. (+) Teeth Intact, Dental Advisory Given   Pulmonary neg pulmonary ROS, asthma , sleep apnea , former smoker   Pulmonary exam normal breath sounds clear to auscultation       Cardiovascular hypertension, Pt. on medications and Pt. on home beta blockers + DOE  negative cardio ROS Normal cardiovascular exam Rhythm:Regular Rate:Normal     Neuro/Psych negative neurological ROS  negative psych ROS   GI/Hepatic negative GI ROS, Neg liver ROS,GERD  Medicated,,  Endo/Other  negative endocrine ROSdiabetes    Renal/GU negative Renal ROS  negative genitourinary   Musculoskeletal negative musculoskeletal ROS (+)    Abdominal   Peds negative pediatric ROS (+)  Hematology negative hematology ROS (+)   Anesthesia Other Findings   Reproductive/Obstetrics negative OB ROS                              Anesthesia Physical Anesthesia Plan  ASA: 3  Anesthesia Plan: General   Post-op Pain Management: Regional block*, Tylenol  PO (pre-op)* and Celebrex PO (pre-op)*   Induction: Intravenous  PONV Risk Score and Plan: 3 and Ondansetron , Dexamethasone , Treatment may vary due to age or medical condition and Midazolam   Airway Management Planned: Oral ETT and Video Laryngoscope Planned  Additional Equipment: None  Intra-op Plan:   Post-operative Plan: Extubation in OR  Informed Consent: I have reviewed the patients History and Physical, chart, labs and discussed the procedure including the risks, benefits and alternatives for the proposed anesthesia with the patient or authorized representative who has indicated his/her  understanding and acceptance.     Dental advisory given  Plan Discussed with: CRNA, Surgeon and Anesthesiologist  Anesthesia Plan Comments: (Discussed both nerve block for pain relief post-op and GA; including NV, sore throat, dental injury, and pulmonary complications)         Anesthesia Quick Evaluation

## 2024-05-11 NOTE — Patient Instructions (Signed)

## 2024-05-11 NOTE — Progress Notes (Signed)
   New Patient Visit   Subjective  Leslie Scott is a 58 y.o. female who presents for the following: spots of concern  Patient here about two spots of concern.  States she doesn't know how long it has been there but it doesn't itch or bleed. No hx of skin cancer.  The following portions of the chart were reviewed this encounter and updated as appropriate: medications, allergies, medical history  Review of Systems:  No other skin or systemic complaints except as noted in HPI or Assessment and Plan.  Objective  Well appearing patient in no apparent distress; mood and affect are within normal limits.  A focused examination was performed of the following areas: Back and right thigh  Relevant exam findings are noted in the Assessment and Plan.    Assessment & Plan   SEBORRHEIC KERATOSIS - Stuck-on, waxy, tan-brown papules and/or plaques  - Benign-appearing - Discussed benign etiology and prognosis. - Observe - Call for any changes  ACTINIC DAMAGE - chronic, secondary to cumulative UV radiation exposure/sun exposure over time - diffuse scaly erythematous macules with underlying dyspigmentation - Recommend daily broad spectrum sunscreen SPF 30+ to sun-exposed areas, reapply every 2 hours as needed.  - Recommend staying in the shade or wearing long sleeves, sun glasses (UVA+UVB protection) and wide brim hats (4-inch brim around the entire circumference of the hat). - Call for new or changing lesions.    Return if symptoms worsen or fail to improve.  I, Berwyn Lesches, Surg Tech III, am acting as scribe for RUFUS CHRISTELLA HOLY, MD.   Documentation: I have reviewed the above documentation for accuracy and completeness, and I agree with the above.  RUFUS CHRISTELLA HOLY, MD

## 2024-05-12 ENCOUNTER — Ambulatory Visit (HOSPITAL_BASED_OUTPATIENT_CLINIC_OR_DEPARTMENT_OTHER)
Admission: RE | Admit: 2024-05-12 | Discharge: 2024-05-12 | Disposition: A | Discharge status: Home / Self Care | Attending: Orthopedic Surgery | Admitting: Orthopedic Surgery

## 2024-05-12 ENCOUNTER — Ambulatory Visit (HOSPITAL_BASED_OUTPATIENT_CLINIC_OR_DEPARTMENT_OTHER): Admitting: Anesthesiology

## 2024-05-12 ENCOUNTER — Encounter (HOSPITAL_BASED_OUTPATIENT_CLINIC_OR_DEPARTMENT_OTHER): Payer: Self-pay | Admitting: Orthopedic Surgery

## 2024-05-12 ENCOUNTER — Encounter (HOSPITAL_BASED_OUTPATIENT_CLINIC_OR_DEPARTMENT_OTHER): Admitting: Anesthesiology

## 2024-05-12 ENCOUNTER — Encounter (HOSPITAL_BASED_OUTPATIENT_CLINIC_OR_DEPARTMENT_OTHER)
Admission: RE | Disposition: A | Discharge status: Home / Self Care | Payer: Self-pay | Source: Home / Self Care | Attending: Orthopedic Surgery

## 2024-05-12 DIAGNOSIS — M75101 Unspecified rotator cuff tear or rupture of right shoulder, not specified as traumatic: Secondary | ICD-10-CM | POA: Insufficient documentation

## 2024-05-12 DIAGNOSIS — G8918 Other acute postprocedural pain: Secondary | ICD-10-CM | POA: Diagnosis not present

## 2024-05-12 DIAGNOSIS — M7541 Impingement syndrome of right shoulder: Secondary | ICD-10-CM | POA: Diagnosis not present

## 2024-05-12 DIAGNOSIS — I10 Essential (primary) hypertension: Secondary | ICD-10-CM

## 2024-05-12 DIAGNOSIS — Z794 Long term (current) use of insulin: Secondary | ICD-10-CM | POA: Insufficient documentation

## 2024-05-12 DIAGNOSIS — M7501 Adhesive capsulitis of right shoulder: Secondary | ICD-10-CM | POA: Insufficient documentation

## 2024-05-12 DIAGNOSIS — M19012 Primary osteoarthritis, left shoulder: Secondary | ICD-10-CM | POA: Diagnosis not present

## 2024-05-12 DIAGNOSIS — Z87891 Personal history of nicotine dependence: Secondary | ICD-10-CM | POA: Insufficient documentation

## 2024-05-12 DIAGNOSIS — K219 Gastro-esophageal reflux disease without esophagitis: Secondary | ICD-10-CM | POA: Insufficient documentation

## 2024-05-12 DIAGNOSIS — M19011 Primary osteoarthritis, right shoulder: Secondary | ICD-10-CM | POA: Insufficient documentation

## 2024-05-12 DIAGNOSIS — J4530 Mild persistent asthma, uncomplicated: Secondary | ICD-10-CM | POA: Insufficient documentation

## 2024-05-12 DIAGNOSIS — Z7984 Long term (current) use of oral hypoglycemic drugs: Secondary | ICD-10-CM | POA: Diagnosis not present

## 2024-05-12 DIAGNOSIS — Z7985 Long-term (current) use of injectable non-insulin antidiabetic drugs: Secondary | ICD-10-CM | POA: Diagnosis not present

## 2024-05-12 DIAGNOSIS — Z79899 Other long term (current) drug therapy: Secondary | ICD-10-CM | POA: Insufficient documentation

## 2024-05-12 DIAGNOSIS — G4733 Obstructive sleep apnea (adult) (pediatric): Secondary | ICD-10-CM | POA: Diagnosis not present

## 2024-05-12 DIAGNOSIS — E119 Type 2 diabetes mellitus without complications: Secondary | ICD-10-CM | POA: Diagnosis not present

## 2024-05-12 DIAGNOSIS — G4730 Sleep apnea, unspecified: Secondary | ICD-10-CM | POA: Diagnosis not present

## 2024-05-12 DIAGNOSIS — L97509 Non-pressure chronic ulcer of other part of unspecified foot with unspecified severity: Secondary | ICD-10-CM

## 2024-05-12 DIAGNOSIS — S46011A Strain of muscle(s) and tendon(s) of the rotator cuff of right shoulder, initial encounter: Secondary | ICD-10-CM | POA: Diagnosis not present

## 2024-05-12 HISTORY — PX: RESECTION DISTAL CLAVICAL: SHX5053

## 2024-05-12 HISTORY — PX: SHOULDER ARTHROSCOPY WITH ROTATOR CUFF REPAIR: SHX5685

## 2024-05-12 LAB — GLUCOSE, CAPILLARY
Glucose-Capillary: 149 mg/dL — ABNORMAL HIGH (ref 70–99)
Glucose-Capillary: 123 mg/dL — ABNORMAL HIGH (ref 70–99)

## 2024-05-12 SURGERY — ARTHROSCOPY, SHOULDER, WITH ROTATOR CUFF REPAIR
Anesthesia: General | Site: Shoulder | Laterality: Right

## 2024-05-12 MED ORDER — ONDANSETRON HCL 4 MG PO TABS: 4.0000 mg | ORAL_TABLET | Freq: Three times a day (TID) | ORAL | 0 refills | Status: AC | PRN

## 2024-05-12 MED ORDER — MEPERIDINE HCL 25 MG/ML IJ SOLN: 6.2500 mg | INTRAMUSCULAR | Status: DC | PRN

## 2024-05-12 MED ORDER — OXYCODONE HCL 5 MG/5ML PO SOLN: 5.0000 mg | Freq: Once | ORAL | Status: DC | PRN

## 2024-05-12 MED ORDER — ROCURONIUM BROMIDE 10 MG/ML (PF) SYRINGE
PREFILLED_SYRINGE | INTRAVENOUS | Status: AC
  Filled 2024-05-12: qty 10

## 2024-05-12 MED ORDER — BACLOFEN 10 MG PO TABS: 10.0000 mg | ORAL_TABLET | Freq: Three times a day (TID) | ORAL | 0 refills | Status: AC

## 2024-05-12 MED ORDER — CELECOXIB 200 MG PO CAPS
ORAL_CAPSULE | ORAL | Status: AC
  Filled 2024-05-12: qty 1

## 2024-05-12 MED ORDER — ONDANSETRON HCL 4 MG/2ML IJ SOLN: 4.0000 mg | Freq: Once | INTRAMUSCULAR | Status: DC | PRN

## 2024-05-12 MED ORDER — ACETAMINOPHEN 500 MG PO TABS
1000.0000 mg | ORAL_TABLET | Freq: Once | ORAL | Status: AC
  Administered 2024-05-12: 1000 mg via ORAL

## 2024-05-12 MED ORDER — PHENYLEPHRINE HCL-NACL 20-0.9 MG/250ML-% IV SOLN
INTRAVENOUS | Status: DC | PRN
  Administered 2024-05-12: 50 ug/min via INTRAVENOUS

## 2024-05-12 MED ORDER — FENTANYL CITRATE (PF) 100 MCG/2ML IJ SOLN
INTRAMUSCULAR | Status: AC
  Filled 2024-05-12: qty 2

## 2024-05-12 MED ORDER — FENTANYL CITRATE (PF) 100 MCG/2ML IJ SOLN: 25.0000 ug | INTRAMUSCULAR | Status: DC | PRN

## 2024-05-12 MED ORDER — FENTANYL CITRATE (PF) 100 MCG/2ML IJ SOLN
100.0000 ug | Freq: Once | INTRAMUSCULAR | Status: AC
  Administered 2024-05-12: 100 ug via INTRAVENOUS

## 2024-05-12 MED ORDER — POVIDONE-IODINE 10 % EX SWAB
2.0000 | Freq: Once | CUTANEOUS | Status: AC
  Administered 2024-05-12: 2 via TOPICAL

## 2024-05-12 MED ORDER — ONDANSETRON HCL 4 MG/2ML IJ SOLN
INTRAMUSCULAR | Status: DC | PRN
  Administered 2024-05-12: 4 mg via INTRAVENOUS

## 2024-05-12 MED ORDER — LIDOCAINE HCL (CARDIAC) PF 100 MG/5ML IV SOSY
PREFILLED_SYRINGE | INTRAVENOUS | Status: DC | PRN
  Administered 2024-05-12: 50 mg via INTRAVENOUS

## 2024-05-12 MED ORDER — BUPIVACAINE HCL (PF) 0.25 % IJ SOLN
INTRAMUSCULAR | Status: AC
  Filled 2024-05-12: qty 30

## 2024-05-12 MED ORDER — PROPOFOL 10 MG/ML IV BOLUS
INTRAVENOUS | Status: AC
  Filled 2024-05-12: qty 20

## 2024-05-12 MED ORDER — BUPIVACAINE HCL (PF) 0.5 % IJ SOLN
INTRAMUSCULAR | Status: DC | PRN
Start: 2024-05-12 — End: 2024-05-12
  Administered 2024-05-12: 10 mL

## 2024-05-12 MED ORDER — TRIAMCINOLONE ACETONIDE 40 MG/ML IJ SUSP
INTRAMUSCULAR | Status: AC
  Filled 2024-05-12: qty 5

## 2024-05-12 MED ORDER — DEXAMETHASONE SODIUM PHOSPHATE 4 MG/ML IJ SOLN
INTRAMUSCULAR | Status: DC | PRN
  Administered 2024-05-12: 8 mg via INTRAVENOUS

## 2024-05-12 MED ORDER — DEXAMETHASONE SODIUM PHOSPHATE 10 MG/ML IJ SOLN
INTRAMUSCULAR | Status: AC
  Filled 2024-05-12: qty 1

## 2024-05-12 MED ORDER — ACETAMINOPHEN 500 MG PO TABS: 1000.0000 mg | ORAL_TABLET | Freq: Once | ORAL | Status: AC

## 2024-05-12 MED ORDER — PROPOFOL 10 MG/ML IV BOLUS
INTRAVENOUS | Status: DC | PRN
  Administered 2024-05-12: 150 mg via INTRAVENOUS

## 2024-05-12 MED ORDER — ALBUTEROL SULFATE HFA 108 (90 BASE) MCG/ACT IN AERS
INHALATION_SPRAY | RESPIRATORY_TRACT | Status: AC
Start: 2024-05-12 — End: 2024-05-12
  Filled 2024-05-12: qty 6.7

## 2024-05-12 MED ORDER — METHYLPREDNISOLONE ACETATE 40 MG/ML IJ SUSP
INTRAMUSCULAR | Status: AC
  Filled 2024-05-12: qty 1

## 2024-05-12 MED ORDER — ROCURONIUM BROMIDE 100 MG/10ML IV SOLN
INTRAVENOUS | Status: DC | PRN
  Administered 2024-05-12: 60 mg via INTRAVENOUS

## 2024-05-12 MED ORDER — ONDANSETRON HCL 4 MG/2ML IJ SOLN
INTRAMUSCULAR | Status: AC
  Filled 2024-05-12: qty 2

## 2024-05-12 MED ORDER — ALBUTEROL SULFATE HFA 108 (90 BASE) MCG/ACT IN AERS
INHALATION_SPRAY | RESPIRATORY_TRACT | Status: DC | PRN
Start: 2024-05-12 — End: 2024-05-12
  Administered 2024-05-12: 4 via RESPIRATORY_TRACT

## 2024-05-12 MED ORDER — LIDOCAINE 2% (20 MG/ML) 5 ML SYRINGE
INTRAMUSCULAR | Status: AC
  Filled 2024-05-12: qty 5

## 2024-05-12 MED ORDER — LACTATED RINGERS IV SOLN: INTRAVENOUS | Status: DC

## 2024-05-12 MED ORDER — FENTANYL CITRATE (PF) 100 MCG/2ML IJ SOLN
INTRAMUSCULAR | Status: DC | PRN
  Administered 2024-05-12 (×2): 50 ug via INTRAVENOUS

## 2024-05-12 MED ORDER — MIDAZOLAM HCL 2 MG/2ML IJ SOLN
INTRAMUSCULAR | Status: AC
  Filled 2024-05-12: qty 2

## 2024-05-12 MED ORDER — ACETAMINOPHEN 500 MG PO TABS
ORAL_TABLET | ORAL | Status: AC
  Filled 2024-05-12: qty 2

## 2024-05-12 MED ORDER — BUPIVACAINE-EPINEPHRINE (PF) 0.5% -1:200000 IJ SOLN
INTRAMUSCULAR | Status: AC
  Filled 2024-05-12: qty 30

## 2024-05-12 MED ORDER — OXYCODONE HCL 5 MG PO TABS: 5.0000 mg | ORAL_TABLET | Freq: Once | ORAL | Status: DC | PRN

## 2024-05-12 MED ORDER — MIDAZOLAM HCL 2 MG/2ML IJ SOLN
2.0000 mg | Freq: Once | INTRAMUSCULAR | Status: AC
  Administered 2024-05-12: 2 mg via INTRAVENOUS

## 2024-05-12 MED ORDER — BUPIVACAINE HCL (PF) 0.5 % IJ SOLN
INTRAMUSCULAR | Status: AC
  Filled 2024-05-12: qty 30

## 2024-05-12 MED ORDER — CELECOXIB 200 MG PO CAPS
200.0000 mg | ORAL_CAPSULE | Freq: Once | ORAL | Status: AC
  Administered 2024-05-12: 200 mg via ORAL

## 2024-05-12 MED ORDER — SENNA-DOCUSATE SODIUM 8.6-50 MG PO TABS: 2.0000 | ORAL_TABLET | Freq: Every day | ORAL | 1 refills | Status: AC

## 2024-05-12 MED ORDER — CEFAZOLIN SODIUM-DEXTROSE 2-4 GM/100ML-% IV SOLN
2.0000 g | INTRAVENOUS | Status: AC
  Administered 2024-05-12: 2 g via INTRAVENOUS

## 2024-05-12 MED ORDER — OXYCODONE HCL 5 MG PO TABS: 5.0000 mg | ORAL_TABLET | ORAL | 0 refills | Status: AC | PRN

## 2024-05-12 MED ORDER — SUGAMMADEX SODIUM 200 MG/2ML IV SOLN
INTRAVENOUS | Status: DC | PRN
  Administered 2024-05-12: 200 mg via INTRAVENOUS

## 2024-05-12 MED ORDER — SODIUM CHLORIDE 0.9 % IR SOLN
Status: DC | PRN
  Administered 2024-05-12 (×4): 3000 mL

## 2024-05-12 MED ORDER — BUPIVACAINE LIPOSOME 1.3 % IJ SUSP
INTRAMUSCULAR | Status: DC | PRN
  Administered 2024-05-12: 10 mL via PERINEURAL

## 2024-05-12 SURGICAL SUPPLY — 42 items
ANCHOR SUT BIO SW 4.75X19.1 (Anchor) IMPLANT
BLADE EXCALIBUR 4.0X13 (MISCELLANEOUS) IMPLANT
BURR OVAL 8 FLU 5.0X13 (MISCELLANEOUS) IMPLANT
CANNULA 5.75X71 LONG (CANNULA) IMPLANT
CANNULA TWIST IN 8.25X7CM (CANNULA) IMPLANT
CHLORAPREP W/TINT 26 (MISCELLANEOUS) IMPLANT
CLSR STERI-STRIP ANTIMIC 1/2X4 (GAUZE/BANDAGES/DRESSINGS) ×1 IMPLANT
DISSECTOR 3.8MM X 13CM (MISCELLANEOUS) ×1 IMPLANT
DRAPE IMP U-DRAPE 54X76 (DRAPES) ×1 IMPLANT
DRAPE INCISE IOBAN 66X45 STRL (DRAPES) IMPLANT
DRAPE SHOULDER BEACH CHAIR (DRAPES) ×1 IMPLANT
DURAPREP 26ML APPLICATOR (WOUND CARE) ×1 IMPLANT
FIBERSTICK 2 (SUTURE) IMPLANT
GAUZE PAD ABD 8X10 STRL (GAUZE/BANDAGES/DRESSINGS) ×1 IMPLANT
GAUZE SPONGE 4X4 12PLY STRL (GAUZE/BANDAGES/DRESSINGS) ×1 IMPLANT
GLOVE BIO SURGEON STRL SZ7 (GLOVE) ×1 IMPLANT
GLOVE BIOGEL PI IND STRL 7.0 (GLOVE) ×1 IMPLANT
GLOVE BIOGEL PI IND STRL 8 (GLOVE) ×2 IMPLANT
GLOVE ORTHO TXT STRL SZ7.5 (GLOVE) ×1 IMPLANT
GOWN STRL REUS W/ TWL LRG LVL3 (GOWN DISPOSABLE) ×1 IMPLANT
GOWN STRL REUS W/ TWL XL LVL3 (GOWN DISPOSABLE) ×2 IMPLANT
KIT STR SPEAR 1.8 FBRTK DISP (KITS) IMPLANT
LASSO 90 CVE QUICKPAS (DISPOSABLE) IMPLANT
MANIFOLD NEPTUNE II (INSTRUMENTS) ×1 IMPLANT
NDL HD SCORPION MEGA LOADER (NEEDLE) IMPLANT
PACK ARTHROSCOPY DSU (CUSTOM PROCEDURE TRAY) ×1 IMPLANT
PACK BASIN DAY SURGERY FS (CUSTOM PROCEDURE TRAY) ×1 IMPLANT
SHEET MEDIUM DRAPE 40X70 STRL (DRAPES) ×1 IMPLANT
SLEEVE SCD COMPRESS KNEE MED (STOCKING) ×1 IMPLANT
SLING ARM FOAM STRAP LRG (SOFTGOODS) IMPLANT
SLING ARM IMMOBILIZER LRG (SOFTGOODS) IMPLANT
SPIKE FLUID TRANSFER (MISCELLANEOUS) IMPLANT
SUPPORT WRAP ARM LG (MISCELLANEOUS) ×1 IMPLANT
SUT MNCRL AB 4-0 PS2 18 (SUTURE) ×1 IMPLANT
SUT TIGER TAPE 7 IN WHITE (SUTURE) IMPLANT
SUT VIC AB 3-0 SH 27X BRD (SUTURE) IMPLANT
SUTURE FIBERWR #2 38 T-5 BLUE (SUTURE) IMPLANT
TAPE FIBER 2MM 7IN #2 BLUE (SUTURE) IMPLANT
TOWEL GREEN STERILE FF (TOWEL DISPOSABLE) ×1 IMPLANT
TUBE CONNECTING 20X1/4 (TUBING) ×1 IMPLANT
TUBING ARTHROSCOPY IRRIG 16FT (MISCELLANEOUS) ×1 IMPLANT
WAND ABLATOR APOLLO I90 (BUR) ×1 IMPLANT

## 2024-05-12 NOTE — Anesthesia Postprocedure Evaluation (Signed)
 Anesthesia Post Note  Patient: Leslie Scott  Procedure(s) Performed: ARTHROSCOPY, SHOULDER, WITH ROTATOR CUFF REPAIR (Right: Shoulder) EXCISION, CLAVICLE, DISTAL (Right: Shoulder)     Patient location during evaluation: PACU Anesthesia Type: General Level of consciousness: awake and alert Pain management: pain level controlled Vital Signs Assessment: post-procedure vital signs reviewed and stable Respiratory status: spontaneous breathing, nonlabored ventilation, respiratory function stable and patient connected to nasal cannula oxygen Cardiovascular status: blood pressure returned to baseline and stable Postop Assessment: no apparent nausea or vomiting Anesthetic complications: no   No notable events documented.  Last Vitals:  Vitals:   05/12/24 1015 05/12/24 1031  BP: (!) 141/71 135/61  Pulse: 71 72  Resp: 14 16  Temp:  (!) 36.2 C  SpO2: 97% 96%    Last Pain:  Vitals:   05/12/24 1031  TempSrc:   PainSc: 0-No pain                 Stephenie Navejas

## 2024-05-12 NOTE — Anesthesia Procedure Notes (Signed)
 Anesthesia Regional Block: Interscalene brachial plexus block   Pre-Anesthetic Checklist: , timeout performed,  Correct Patient, Correct Site, Correct Laterality,  Correct Procedure, Correct Position, site marked,  Risks and benefits discussed,  Surgical consent,  Pre-op evaluation,  At surgeon's request and post-op pain management  Laterality: Right  Prep: chloraprep       Needles:  Injection technique: Single-shot  Needle Type: Echogenic Stimulator Needle     Needle Length: 5cm  Needle Gauge: 22     Additional Needles:   Procedures:, nerve stimulator,,, ultrasound used (permanent image in chart),,     Nerve Stimulator or Paresthesia:  Response: hand, 0.45 mA  Additional Responses:   Narrative:  Start time: 05/12/2024 7:00 AM End time: 05/12/2024 7:05 AM Injection made incrementally with aspirations every 5 mL.  Performed by: Personally  Anesthesiologist: Mallory Manus, MD  Additional Notes: Functioning IV was confirmed and monitors were applied.  A 50mm 22ga Arrow echogenic stimulator needle was used. Sterile prep and drape,hand hygiene and sterile gloves were used. Ultrasound guidance: relevant anatomy identified, needle position confirmed, local anesthetic spread visualized around nerve(s)., vascular puncture avoided.  Image printed for medical record. Negative aspiration and negative test dose prior to incremental administration of local anesthetic. The patient tolerated the procedure well.

## 2024-05-12 NOTE — Anesthesia Procedure Notes (Signed)
 Procedure Name: Intubation Date/Time: 05/12/2024 7:40 AM  Performed by: Buster Catheryn SAUNDERS, CRNAPre-anesthesia Checklist: Patient identified, Emergency Drugs available, Suction available and Patient being monitored Patient Re-evaluated:Patient Re-evaluated prior to induction Oxygen Delivery Method: Circle system utilized Preoxygenation: Pre-oxygenation with 100% oxygen Induction Type: IV induction Ventilation: Mask ventilation without difficulty Laryngoscope Size: Glidescope and 3 Tube type: Oral Tube size: 7.0 mm Number of attempts: 1 Airway Equipment and Method: Stylet and Video-laryngoscopy Placement Confirmation: ETT inserted through vocal cords under direct vision, positive ETCO2 and breath sounds checked- equal and bilateral Secured at: 22 cm Tube secured with: Tape Dental Injury: Teeth and Oropharynx as per pre-operative assessment

## 2024-05-12 NOTE — Interval H&P Note (Signed)
 History and Physical Interval Note:  05/12/2024 7:22 AM  Leslie Scott  has presented today for surgery, with the diagnosis of right rotator cuff tear, bursitis, articular cartilage disorder.  The various methods of treatment have been discussed with the patient and family. After consideration of risks, benefits and other options for treatment, the patient has consented to  Procedure(s): ARTHROSCOPY, SHOULDER, WITH ROTATOR CUFF REPAIR (Right) as a surgical intervention.  The patient's history has been reviewed, patient examined, no change in status, stable for surgery.  I have reviewed the patient's chart and labs.  Questions were answered to the patient's satisfaction.     Leslie Scott

## 2024-05-12 NOTE — Transfer of Care (Signed)
 Immediate Anesthesia Transfer of Care Note  Patient: Leslie Scott  Procedure(s) Performed: ARTHROSCOPY, SHOULDER, WITH ROTATOR CUFF REPAIR (Right: Shoulder) EXCISION, CLAVICLE, DISTAL (Right: Shoulder)  Patient Location: PACU  Anesthesia Type:General  Level of Consciousness: awake, alert , and oriented  Airway & Oxygen Therapy: Patient Spontanous Breathing and Patient connected to face mask oxygen  Post-op Assessment: Report given to RN and Post -op Vital signs reviewed and stable  Post vital signs: Reviewed and stable  Last Vitals:  Vitals Value Taken Time  BP 132/59 05/12/24 09:40  Temp    Pulse 78 05/12/24 09:41  Resp 13 05/12/24 09:41  SpO2 99 % 05/12/24 09:41  Vitals shown include unfiled device data.  Last Pain:  Vitals:   05/12/24 0641  TempSrc: Temporal  PainSc: 0-No pain      Patients Stated Pain Goal: 3 (05/12/24 0641)  Complications: No notable events documented.

## 2024-05-12 NOTE — Progress Notes (Signed)
Assisted Dr. Oddono with right, interscalene, ultrasound guided block. Side rails up, monitors on throughout procedure. See vital signs in flow sheet. Tolerated Procedure well. 

## 2024-05-12 NOTE — Discharge Instructions (Addendum)
 No tylenol  before 1pm. No anti-inflammatories before 1pm. See MD note below regarding anti-inflammatory meds.  Shoulder Arthroscopy Rotator Cuff Repair Post-Operative Instructions  Diet: Start with some clear liquids, soups, etc, and advance to your regular diet as tolerated.   Dressing:  You may remove your dressing 3-5 days after surgery and shower.  There are steri-strips (white strips) over the incisions.  Your stitches are absorbable.  Leave the steri-strips in place when changing your dressings, they will peel off with time, usually 2-3 weeks.  Keep your wounds covered with band-aids/gauze until your first post-op appointment.  Activity:  Keep the sling on at all times except for hygiene.  It is OK to begin with exercises moving your elbow up and down, and working your hand, wrist, and fingers to keep them limber, but DO NOT try lifting your arm or rotating it out, as this will put dangerous stress on the repair.  You cannot drive while taking narcotic medications or while you are in the sling.  We generally do not begin therapy until after we get through the first phases of tendon healing, at six weeks after the surgery.  This is in order to reduce the risk of disrupting the repair.    Weight Bearing:   Do not lift your arm.  You can get dressed by leaning forward and letting the arm dangle and sliding your shirt sleeve over the operative arm first, without having to lift the arm.    Medications:  You will want to take some of your pain medications tonight before going to bed to make sure you have something in your system when the numbing medicine/block wears off.  The maximum dose of Tylenol /Acetaminophen  in a day is 3,000-4,000 mg, and beware that your pain medication may have Tylenol  (acetaminophen ) in it.  As your pain improves, you can begin to taper the amount of narcotic you are using. You may want to avoid using ibuprofen/motrin/NSAIDs for the first 4-6 weeks, as they can slow down tendon  and bone healing.    To prevent constipation: you may use a stool softener such as -  Colace (over the counter) 100 mg by mouth twice a day  Drink plenty of fluids (prune juice may be helpful) and high fiber foods Miralax (over the counter) for constipation as needed.    Itching:  If you experience itching or other side effects with your pain medications, try taking only a single pain pill, or even half a pain pill at a time.  You can also use Benadryl  for itching or also to help with sleep.   Precautions:  If you experience chest pain or shortness of breath - call 911 immediately for transfer to the hospital emergency department!!  If you develop a fever greater that 101 F, purulent drainage from wound, increased redness or drainage from wound, or calf pain -- Call the office at [phone]                                                 Follow- Up Appointment:  Please call for an appointment to be seen in 2 weeks [phone] in [place].  After-Hours:  We have an Urgent Care available for after-hours emergencies located at the [facility] office at [facility] in [place] open from 5:30p-9p every night, and from 10a-2p on Saturday and  Sunday.  There is also an on call provider after fours available for urgent questions that can be reached at [phone]     Regional Anesthesia Blocks  1. You may not be able to move or feel the blocked extremity after a regional anesthetic block. This may last may last from 3-48 hours after placement, but it will go away. The length of time depends on the medication injected and your individual response to the medication. As the nerves start to wake up, you may experience tingling as the movement and feeling returns to your extremity. If the numbness and inability to move your extremity has not gone away after 48 hours, please call your surgeon.   2. The extremity that is blocked will need to be protected until the numbness is gone and the  strength has returned. Because you cannot feel it, you will need to take extra care to avoid injury. Because it may be weak, you may have difficulty moving it or using it. You may not know what position it is in without looking at it while the block is in effect.  3. For blocks in the legs and feet, returning to weight bearing and walking needs to be done carefully. You will need to wait until the numbness is entirely gone and the strength has returned. You should be able to move your leg and foot normally before you try and bear weight or walk. You will need someone to be with you when you first try to ensure you do not fall and possibly risk injury.  4. Bruising and tenderness at the needle site are common side effects and will resolve in a few days.  5. Persistent numbness or new problems with movement should be communicated to the surgeon or the [facility] [phone] [facility] [phone]).   Information for Discharge Teaching: EXPAREL (bupivacaine liposome injectable suspension)   Pain relief is important to your recovery. The goal is to control your pain so you can move easier and return to your normal activities as soon as possible after your procedure. Your physician may use several types of medicines to manage pain, swelling, and more.  Your surgeon or anesthesiologist gave you EXPAREL(bupivacaine) to help control your pain after surgery.  EXPAREL is a local anesthetic designed to release slowly over an extended period of time to provide pain relief by numbing the tissue around the surgical site. EXPAREL is designed to release pain medication over time and can control pain for up to 72 hours. Depending on how you respond to EXPAREL, you may require less pain medication during your recovery. EXPAREL can help reduce or eliminate the need for opioids during the first few days after surgery when pain relief is needed the most. EXPAREL is not an opioid and  is not addictive. It does not cause sleepiness or sedation.   Important! A teal colored band has been placed on your arm with the date, time and amount of EXPAREL you have received. Please leave this armband in place for the full 96 hours following administration, and then you may remove the band. If you return to the hospital for any reason within 96 hours following the administration of EXPAREL, the armband provides important information that your health care providers to know, and alerts them that you have received this anesthetic.    Possible side effects of EXPAREL: Temporary loss of sensation or ability to move in the area where medication was injected. Nausea, vomiting, constipation Rarely,  numbness and tingling in your mouth or lips, lightheadedness, or anxiety may occur. Call your doctor right away if you think you may be experiencing any of these sensations, or if you have other questions regarding possible side effects.  Follow all other discharge instructions given to you by your surgeon or nurse. Eat a healthy diet and drink plenty of water or other fluids.   Post Anesthesia Home Care Instructions  Activity: Get plenty of rest for the remainder of the day. A responsible individual must stay with you for 24 hours following the procedure.  For the next 24 hours, DO NOT: -Drive a car -Advertising copywriter -Drink alcoholic beverages -Take any medication unless instructed by your physician -Make any legal decisions or sign important papers.  Meals: Start with liquid foods such as gelatin or soup. Progress to regular foods as tolerated. Avoid greasy, spicy, heavy foods. If nausea and/or vomiting occur, drink only clear liquids until the nausea and/or vomiting subsides. Call your physician if vomiting continues.  Special Instructions/Symptoms: Your throat may feel dry or sore from the anesthesia or the breathing tube placed in your throat during surgery. If this causes discomfort,  gargle with warm salt water. The discomfort should disappear within 24 hours.  If you had a scopolamine  patch placed behind your ear for the management of post- operative nausea and/or vomiting:  1. The medication in the patch is effective for 72 hours, after which it should be removed.  Wrap patch in a tissue and discard in the trash. Wash hands thoroughly with soap and water. 2. You may remove the patch earlier than 72 hours if you experience unpleasant side effects which may include dry mouth, dizziness or visual disturbances. 3. Avoid touching the patch. Wash your hands with soap and water after contact with the patch.

## 2024-05-12 NOTE — Op Note (Signed)
 05/12/2024  9:21 AM  PATIENT:  Leslie Scott    PRE-OPERATIVE DIAGNOSIS: Right shoulder chronic impingement syndrome, AC joint arthrosis, supraspinatus tear  POST-OPERATIVE DIAGNOSIS: Right shoulder chronic impingement syndrome, AC joint arthrosis, supraspinatus tear, with adhesive capsulitis  PROCEDURE: Right shoulder manipulation under anesthesia, arthroscopy with extensive debridement of the supraspinatus, subdeltoid fascia, subacromial bursa, with acromioplasty, distal clavicle resection, and arthroscopic supraspinatus repair  SURGEON:  Fonda SHAUNNA Olmsted, MD  PHYSICIAN ASSISTANT: Army Daring, PA-C, present and scrubbed throughout the case, critical for completion in a timely fashion, and for retraction, instrumentation, and closure.  ANESTHESIA:   General with regional block  PREOPERATIVE INDICATIONS:  Leslie Scott is a  58 y.o. female with significant shoulder pain who failed conservative measures and elected for surgical management.  She had significant loss of motion preoperatively with only about 0 to 40 degrees.  Significant pain poorly controlled despite narcotics.  The risks benefits and alternatives were discussed with the patient preoperatively including but not limited to the risks of infection, bleeding, nerve injury, cardiopulmonary complications, the need for revision surgery, among others, and the patient was willing to proceed.  ESTIMATED BLOOD LOSS: Minimal  OPERATIVE IMPLANTS: Arthrex Biocomposite SwiveLock 4.75 x 1 for the medial row preloaded with fibertape, and 2 lateral Biocomposite SwiveLock 4.75 mm anchors for the lateral row in a crossing configuration.  OPERATIVE FINDINGS: She had significant adhesive capsulitis, and manipulation under anesthesia yielded an additional 40 degrees of motion.  Passively she only went to about 120, but I was able to get full motion with the manipulation.  The glenohumeral articular surfaces were intact, the biceps was intact, the  subscapularis was also intact, there was a full-thickness supraspinatus tear that was visible both from below and above.  This was about 1 x 2 cm.  There was significant subacromial spurring and fraying of the CA ligament, with arthrosis of the distal clavicle AC joint.   OPERATIVE PROCEDURE: The patient was brought to the operating room and placed in the supine position.  General anesthesia was administered and the patient positioned in a beach chair position.  IV antibiotics were given.  The upper extremity was prepped and draped in the usual sterile fashion.  Timeout performed.  Manipulation under anesthesia yielded significant lysis of adhesions particularly superiorly.  Diagnostic arthroscopy was carried out with the above named findings.    I debrided the undersurface of the supraspinatus from below, and then went to the subacromial space.  I performed a complete bursectomy, subacromial CA ligament release, with a acromioplasty.  She had some irregularity on the greater tuberosity which I smoothened with the bur.  I removed some of the subdeltoid fascia.    I evaluated the tear from viewing laterally, used the shaver from posterior, debrided the tear as well as the bony footprint, and prepared the tendon for reinsertion.  I placed 1 anchor from above for the medial row preloaded with fiber tape in each anchor.  I passed the sutures using a scorpion suture passer from front to back, and then brought these into lateral anchors.  Excellent fixation and reduction of the tendon was achieved.  I touched up the acromioplasty viewing from lateral portal.  I then turned my attention to the distal clavicle, and 1 cm of distal clavicle was resected and confirmed visualization from anteriorly.  The instruments were removed, the portals closed with Monocryl followed by Steri-Strips and sterile gauze.  The patient was awakened and returned to the PACU in stable and satisfactory  condition.  There were no  complications and she tolerated the procedure well.

## 2024-05-13 ENCOUNTER — Encounter (HOSPITAL_BASED_OUTPATIENT_CLINIC_OR_DEPARTMENT_OTHER): Payer: Self-pay | Admitting: Orthopedic Surgery

## 2024-05-27 DIAGNOSIS — M25511 Pain in right shoulder: Secondary | ICD-10-CM | POA: Diagnosis not present

## 2024-08-17 NOTE — Progress Notes (Signed)
 Leslie Scott                                          MRN: [id number]   08/17/2024   The [facility] Quality Team Specialist reviewed this patient medical record for the purposes of chart review for care gap closure. The following were reviewed: abstraction for care gap closure-glycemic status assessment.    [facility] Quality Team
